# Patient Record
Sex: Male | Born: 1950 | Race: White | Hispanic: No | Marital: Married | Smoking: Former smoker
Health system: Southern US, Community
[De-identification: ages and names within clinical notes are randomized; demographics above are authoritative.]

## PROBLEM LIST (undated history)

## (undated) DIAGNOSIS — E785 Hyperlipidemia, unspecified: Secondary | ICD-10-CM

## (undated) DIAGNOSIS — I10 Essential (primary) hypertension: Secondary | ICD-10-CM

## (undated) DIAGNOSIS — M109 Gout, unspecified: Secondary | ICD-10-CM

## (undated) DIAGNOSIS — N281 Cyst of kidney, acquired: Secondary | ICD-10-CM

## (undated) DIAGNOSIS — I200 Unstable angina: Secondary | ICD-10-CM

## (undated) DIAGNOSIS — I2510 Atherosclerotic heart disease of native coronary artery without angina pectoris: Secondary | ICD-10-CM

## (undated) DIAGNOSIS — K635 Polyp of colon: Secondary | ICD-10-CM

## (undated) DIAGNOSIS — R7989 Other specified abnormal findings of blood chemistry: Secondary | ICD-10-CM

## (undated) DIAGNOSIS — R945 Abnormal results of liver function studies: Secondary | ICD-10-CM

## (undated) HISTORY — PX: CARDIAC CATHETERIZATION: SHX172

## (undated) HISTORY — PX: OTHER SURGICAL HISTORY: SHX169

## (undated) HISTORY — DX: Hyperlipidemia, unspecified: E78.5

## (undated) HISTORY — PX: COLONOSCOPY: SHX174

## (undated) HISTORY — DX: Abnormal results of liver function studies: R94.5

## (undated) HISTORY — DX: Polyp of colon: K63.5

## (undated) HISTORY — DX: Cyst of kidney, acquired: N28.1

## (undated) HISTORY — DX: Other specified abnormal findings of blood chemistry: R79.89

---

## 2004-01-04 DIAGNOSIS — Z860100 Personal history of colon polyps, unspecified: Secondary | ICD-10-CM | POA: Insufficient documentation

## 2007-12-27 ENCOUNTER — Ambulatory Visit: Payer: Self-pay | Admitting: Internal Medicine

## 2008-01-12 ENCOUNTER — Encounter: Payer: Self-pay | Admitting: Internal Medicine

## 2008-12-20 ENCOUNTER — Encounter (INDEPENDENT_AMBULATORY_CARE_PROVIDER_SITE_OTHER): Payer: Self-pay | Admitting: *Deleted

## 2009-10-18 ENCOUNTER — Telehealth: Payer: Self-pay | Admitting: Internal Medicine

## 2010-11-05 NOTE — Progress Notes (Signed)
Summary: Schedule Colonoscopy  Phone Note Outgoing Call Call back at Home Phone [phone]   Call placed by: Harlow Mares CMA Duncan Dull),  October 18, 2009 10:27 AM Call placed to: Patient Summary of Call: patient has a language barrier, but understands that I am calling about his colonoscopy and he is not interested. He states that it is to much water to drink and he is not interested he feels good. I told him he had polyps and I told him that Dr. Leone Payor would at least like him to come in to discuss the importance of the procedure but he is not interested at this time.  Initial call taken by: Harlow Mares CMA (AAMA),  October 18, 2009 10:29 AM

## 2011-02-18 NOTE — Assessment & Plan Note (Signed)
 HEALTHCARE                         GASTROENTEROLOGY OFFICE NOTE   NAME:George Burns, George Burns                 MRN:          [id number]  DATE:12/27/2007                            DOB:          Oct 25, 1950    CHIEF COMPLAINT:  Hemorrhoids.   This is a 60 year old [place] immigrant who has a diagnosis of  hemorrhoids from colonoscopy 5-6 years ago in [place].  He is now  having some itching and swelling and possibly some bright red blood on  the tissue paper.  He does have an interpreter but he speaks [place]  pretty well.  His bowel movements other than a rare hard stool are  formed and regular.  He has been using some Preparation-H  intermittently.   REVIEW OF SYSTEMS:  GI:  Otherwise unremarkable.  The remainder of the  review of systems is negative.   MEDICATIONS:  None, other than the Preparation-H.   ALLERGIES:  No known drug allergies.   PAST MEDICAL HISTORY:  1. He relates a possible history of hypertension.  2. He has shoulder pain and wonders if he has arthritis.   He denies any surgeries.   FAMILY HISTORY:  No colon cancer.  Our review is negative otherwise.   SOCIAL HISTORY:  He is married.  He is a Curator.  One son, two  daughters.  He has been in this country about 15 years and in [place]  about 2-3 years.  He drinks some alcohol.  No tobacco or drugs.   PHYSICAL EXAMINATION:  GENERAL:  Reveals a well developed, overweight to  obese, white man.  VITAL SIGNS:  Height 5 feet 5 inches, weight 180 pounds, blood pressure  140/82, pulse is 90 and regular.  EYES:  Anicteric.  ENT:  Normal mouth posterior pharynx.  NECK:  Supple.  No thyromegaly or mass.  CHEST:  Clear.  HEART:  S1 S2.  No murmurs, gallops, or rubs.  ABDOMEN:  Soft and nontender without organomegaly, mass.  RECTAL:  Reveals brown stool.  Hemoccult negative.  No mass.  There is  induration and obvious hemorrhoids right at the anal opening as well as  in the  canal.  There is no mass, no polypoid lesion.  Prostate is  normal.  SKIN:  He has multiple areas of sunburn from sun exposure over the  weekend.   ASSESSMENT:  1. Symptomatic internal hemorrhoids.  2. I should mention that the colonoscopy he had 5-6 years ago raises      the question of a polyp.  He is not sure.   PLAN:  1. Proctocream-HC 2.5% as needed to hemorrhoids.  2. We will request the records of his previous colonoscopy.  These      symptoms and signs evident today do not warrant a colonoscopy,      however, if he had an adenomatous colon polyp in the past it may be      appropriate.  3. He is advised to obtain a primary care physician.     Iva Boop, MD,FACG  Electronically Signed    CEG/MedQ  DD: 12/27/2007  DT: 12/27/2007  Job #: [id number]

## 2013-10-06 DIAGNOSIS — I2510 Atherosclerotic heart disease of native coronary artery without angina pectoris: Secondary | ICD-10-CM

## 2013-10-06 HISTORY — DX: Atherosclerotic heart disease of native coronary artery without angina pectoris: I25.10

## 2013-12-26 ENCOUNTER — Inpatient Hospital Stay (HOSPITAL_COMMUNITY)
Admission: EM | Admit: 2013-12-26 | Discharge: 2013-12-28 | DRG: 247 | Disposition: A | Discharge status: Home / Self Care | Payer: BC Managed Care – PPO | Attending: Internal Medicine | Admitting: Internal Medicine

## 2013-12-26 ENCOUNTER — Emergency Department (HOSPITAL_COMMUNITY): Payer: BC Managed Care – PPO

## 2013-12-26 ENCOUNTER — Encounter (HOSPITAL_COMMUNITY): Payer: Self-pay | Admitting: Emergency Medicine

## 2013-12-26 DIAGNOSIS — R61 Generalized hyperhidrosis: Secondary | ICD-10-CM | POA: Diagnosis present

## 2013-12-26 DIAGNOSIS — M109 Gout, unspecified: Secondary | ICD-10-CM | POA: Diagnosis present

## 2013-12-26 DIAGNOSIS — I200 Unstable angina: Secondary | ICD-10-CM | POA: Diagnosis present

## 2013-12-26 DIAGNOSIS — I208 Other forms of angina pectoris: Secondary | ICD-10-CM | POA: Diagnosis present

## 2013-12-26 DIAGNOSIS — I10 Essential (primary) hypertension: Secondary | ICD-10-CM | POA: Diagnosis present

## 2013-12-26 DIAGNOSIS — R0602 Shortness of breath: Secondary | ICD-10-CM | POA: Diagnosis present

## 2013-12-26 DIAGNOSIS — I2510 Atherosclerotic heart disease of native coronary artery without angina pectoris: Principal | ICD-10-CM | POA: Diagnosis present

## 2013-12-26 DIAGNOSIS — Z87891 Personal history of nicotine dependence: Secondary | ICD-10-CM

## 2013-12-26 DIAGNOSIS — R079 Chest pain, unspecified: Secondary | ICD-10-CM

## 2013-12-26 DIAGNOSIS — E785 Hyperlipidemia, unspecified: Secondary | ICD-10-CM | POA: Diagnosis present

## 2013-12-26 HISTORY — DX: Essential (primary) hypertension: I10

## 2013-12-26 HISTORY — DX: Unstable angina: I20.0

## 2013-12-26 HISTORY — DX: Gout, unspecified: M10.9

## 2013-12-26 LAB — CBC
HCT: 47.3 % (ref 39.0–52.0)
Hemoglobin: 16.5 g/dL (ref 13.0–17.0)
MCH: 31.7 pg (ref 26.0–34.0)
MCHC: 34.9 g/dL (ref 30.0–36.0)
MCV: 91 fL (ref 78.0–100.0)
PLATELETS: 193 10*3/uL (ref 150–400)
RBC: 5.2 MIL/uL (ref 4.22–5.81)
RDW: 12.8 % (ref 11.5–15.5)
WBC: 8.1 10*3/uL (ref 4.0–10.5)

## 2013-12-26 LAB — TROPONIN I
Troponin I: <0.3 ng/mL (ref <0.30)
Troponin I: <0.3 ng/mL (ref <0.30)

## 2013-12-26 LAB — BASIC METABOLIC PANEL
BUN: 14 mg/dL (ref 6–23)
CALCIUM: 10.1 mg/dL (ref 8.4–10.5)
CHLORIDE: 102 meq/L (ref 96–112)
CO2: 26 meq/L (ref 19–32)
Creatinine, Ser: 0.92 mg/dL (ref 0.50–1.35)
GFR calc Af Amer: >90 mL/min (ref >90)
GFR calc non Af Amer: 90 mL/min — ABNORMAL LOW (ref >90)
GLUCOSE: 118 mg/dL — AB (ref 70–99)
Potassium: 5 mEq/L (ref 3.7–5.3)
SODIUM: 143 meq/L (ref 137–147)

## 2013-12-26 LAB — I-STAT TROPONIN, ED: TROPONIN I, POC: 0.04 ng/mL (ref 0.00–0.08)

## 2013-12-26 MED ORDER — SODIUM CHLORIDE 0.9 % IJ SOLN: 3.0000 mL | Freq: Two times a day (BID) | INTRAMUSCULAR | Status: DC

## 2013-12-26 MED ORDER — SODIUM CHLORIDE 0.9 % IJ SOLN: 3.0000 mL | INTRAMUSCULAR | Status: DC | PRN

## 2013-12-26 MED ORDER — SODIUM CHLORIDE 0.9 % IJ SOLN
3.0000 mL | Freq: Two times a day (BID) | INTRAMUSCULAR | Status: DC
  Administered 2013-12-26: 3 mL via INTRAVENOUS

## 2013-12-26 MED ORDER — ATORVASTATIN CALCIUM 20 MG PO TABS
20.0000 mg | ORAL_TABLET | Freq: Every day | ORAL | Status: DC
  Administered 2013-12-26: 20 mg via ORAL
  Filled 2013-12-26 (×2): qty 1

## 2013-12-26 MED ORDER — HYDROCODONE-ACETAMINOPHEN 5-325 MG PO TABS: 1.0000 | ORAL_TABLET | ORAL | Status: DC | PRN

## 2013-12-26 MED ORDER — ADULT MULTIVITAMIN W/MINERALS CH
1.0000 | ORAL_TABLET | Freq: Every day | ORAL | Status: DC
  Administered 2013-12-27 – 2013-12-28 (×2): 1 via ORAL
  Filled 2013-12-26 (×2): qty 1

## 2013-12-26 MED ORDER — ACETAMINOPHEN 325 MG PO TABS: 650.0000 mg | ORAL_TABLET | Freq: Four times a day (QID) | ORAL | Status: DC | PRN

## 2013-12-26 MED ORDER — ASPIRIN EC 325 MG PO TBEC: 325.0000 mg | DELAYED_RELEASE_TABLET | Freq: Every day | ORAL | Status: DC

## 2013-12-26 MED ORDER — ONDANSETRON HCL 4 MG PO TABS: 4.0000 mg | ORAL_TABLET | Freq: Four times a day (QID) | ORAL | Status: DC | PRN

## 2013-12-26 MED ORDER — ASPIRIN 81 MG PO CHEW
324.0000 mg | CHEWABLE_TABLET | Freq: Once | ORAL | Status: AC
  Administered 2013-12-26: 324 mg via ORAL
  Filled 2013-12-26: qty 4

## 2013-12-26 MED ORDER — NITROGLYCERIN 0.4 MG SL SUBL: 0.4000 mg | SUBLINGUAL_TABLET | SUBLINGUAL | Status: DC | PRN

## 2013-12-26 MED ORDER — SODIUM CHLORIDE 0.9 % IV SOLN
1.0000 mL/kg/h | INTRAVENOUS | Status: DC
  Administered 2013-12-26 – 2013-12-27 (×2): 1 mL/kg/h via INTRAVENOUS

## 2013-12-26 MED ORDER — SODIUM CHLORIDE 0.9 % IV SOLN: 250.0000 mL | INTRAVENOUS | Status: DC | PRN

## 2013-12-26 MED ORDER — METOPROLOL TARTRATE 12.5 MG HALF TABLET
12.5000 mg | ORAL_TABLET | Freq: Two times a day (BID) | ORAL | Status: DC
Start: 2013-12-26 — End: 2013-12-28
  Administered 2013-12-26 – 2013-12-28 (×4): 12.5 mg via ORAL
  Filled 2013-12-26 (×6): qty 1

## 2013-12-26 MED ORDER — ASPIRIN 325 MG PO TABS: 325.0000 mg | ORAL_TABLET | ORAL | Status: DC

## 2013-12-26 MED ORDER — ENOXAPARIN SODIUM 40 MG/0.4ML ~~LOC~~ SOLN
40.0000 mg | SUBCUTANEOUS | Status: DC
  Administered 2013-12-26: 40 mg via SUBCUTANEOUS
  Filled 2013-12-26 (×3): qty 0.4

## 2013-12-26 MED ORDER — ASPIRIN 81 MG PO CHEW
81.0000 mg | CHEWABLE_TABLET | ORAL | Status: AC
  Administered 2013-12-27: 81 mg via ORAL
  Filled 2013-12-26: qty 1

## 2013-12-26 MED ORDER — ACETAMINOPHEN 650 MG RE SUPP: 650.0000 mg | Freq: Four times a day (QID) | RECTAL | Status: DC | PRN

## 2013-12-26 MED ORDER — ONDANSETRON HCL 4 MG/2ML IJ SOLN: 4.0000 mg | Freq: Four times a day (QID) | INTRAMUSCULAR | Status: DC | PRN

## 2013-12-26 NOTE — H&P (Signed)
[facility] History and Physical  JARELL MCEWEN WUJ:[id number] DOB: [birth year] DOA: 12/26/2013  Referring physician: Dr Lawrence Marseilles PCP: Pcp Not In System   Chief Complaint: Chest pain.   HPI: George Burns is a 63 y.o. male With PMH significant for Hypertension, his BP has been controlled off mediations, Hyperlipidemia, who presents complaining of chest pain that started day prior to admission. He was walking yesterday with his wife, every time he walk up the hill he develop chest pain, dyspnea, weakness, SOB. Chest pain resolved on rest. He had 3 episode yesterday. He saw his PCP and he was refer to see a cardiologist, but next appointment available was in 2 weeks. He was notice to have EKG changes compare to prior EKG.  He is chest pain free, first troponin negative, EKG with T wave inversion V 4 to V 6.     Review of Systems:  Negative except as per HPI.   Past Medical History  Diagnosis Date  . Hypertension   . Gout    History reviewed. No pertinent past surgical history. Social History:  reports that he has never smoked. He does not have any smokeless tobacco history on file. He reports that he drinks alcohol. He reports that he does not use illicit drugs. 28 year of smoking.   No Known Allergies  Family History; Mother: Heart diseases, HTN. Father died of lung cancer.   Prior to Admission medications   Medication Sig Start Date End Date Taking? Authorizing Provider  B Complex-C (B-COMPLEX WITH VITAMIN C) tablet Take 1 tablet by mouth daily.   Yes Historical Provider, MD  Flaxseed, Linseed, (FLAX SEED OIL PO) Take 1 tablet by mouth daily.   Yes Historical Provider, MD  Multiple Vitamins-Minerals (MULTIVITAMIN WITH MINERALS) tablet Take 1 tablet by mouth daily.   Yes Historical Provider, MD  VITAMIN E PO Take 1 tablet by mouth daily.   Yes Historical Provider, MD   Physical Exam: Filed Vitals:   12/26/13 1330  BP: 136/82  Pulse: 82  Temp:   Resp: 23      BP 136/82  Pulse 82  Temp(Src) 98.3 F (36.8 C) (Oral)  Resp 23  SpO2 96%  General:  Appears calm and comfortable Eyes: PERRL, normal lids, irises & conjunctiva ENT: grossly normal hearing, lips & tongue Neck: no LAD, masses or thyromegaly Cardiovascular: RRR, no m/r/g. No LE edema. Telemetry: SR, no arrhythmias  Respiratory: CTA bilaterally, no w/r/r. Normal respiratory effort. Abdomen: soft, ntnd Skin: no rash or induration seen on limited exam Musculoskeletal: grossly normal tone BUE/BLE Psychiatric: grossly normal mood and affect, speech fluent and appropriate Neurologic: grossly non-focal.          Labs on Admission:  Basic Metabolic Panel:  Recent Labs Lab 12/26/13 1145  NA 143  K 5.0  CL 102  CO2 26  GLUCOSE 118*  BUN 14  CREATININE 0.92  CALCIUM 10.1   Liver Function Tests: No results found for this basename: AST, ALT, ALKPHOS, BILITOT, PROT, ALBUMIN,  in the last 168 hours No results found for this basename: LIPASE, AMYLASE,  in the last 168 hours No results found for this basename: AMMONIA,  in the last 168 hours CBC:  Recent Labs Lab 12/26/13 1145  WBC 8.1  HGB 16.5  HCT 47.3  MCV 91.0  PLT 193   Cardiac Enzymes: No results found for this basename: CKTOTAL, CKMB, CKMBINDEX, TROPONINI,  in the last 168 hours  BNP (last 3 results) No results found for  this basename: PROBNP,  in the last 8760 hours CBG: No results found for this basename: GLUCAP,  in the last 168 hours  Radiological Exams on Admission: Dg Chest 2 View  12/26/2013   CLINICAL DATA:  Chest pain  EXAM: CHEST  2 VIEW  COMPARISON:  None.  FINDINGS: The heart size and mediastinal contours are within normal limits. Both lungs are clear. The visualized skeletal structures are unremarkable.  IMPRESSION: No active cardiopulmonary disease.   Electronically Signed   By: Britta MccreedySusan  Turner M.D.   On: 12/26/2013 13:05    EKG: Independently reviewed.   Assessment/Plan Principal Problem:    Chest pain Active Problems:   Hyperlipidemia   History of smoking   Hypertension  1-Chest pain; with typical symptoms. EKG with T wave inversion V 4 to V 6. First troponin negative. Chest X Ray negative. Continue to cycle cardiac enzymes. EKG in Am. Cardiology consulted. Nitroglycerin PRN, Aspirin.   2-Hypertension; well controlled. Not on medications.  3-Hyperlipidemia; check Lipid panel.   Code Status: Presume Full Code.  Family Communication: Care discussed with wife.  Disposition Plan: Admit under observation less than 2 nights.   Time spent: 75 minutes.   Hartley Barefootegalado, Aneyah Lortz A [facility] Pager [phone]

## 2013-12-26 NOTE — ED Notes (Signed)
Pt reports left sided chest pain radiating to left arm while walking in the park yesterday. Pt was diaphoretic after walk, pt not feeling well. Pt began to feel better after resting yesterday afternoon. This AM saw his PMD and was sent over for further eval. Pt resp, even unlabored, skin warm and dry.

## 2013-12-26 NOTE — ED Provider Notes (Signed)
CSN: [id number]     Arrival date & time 12/26/13  1123 History   First MD Initiated Contact with Patient 12/26/13 1145     Chief Complaint  Patient presents with  . Chest Pain     (Consider location/radiation/quality/duration/timing/severity/associated sxs/prior Treatment) HPI 63 year old male presents from his primary care physician's office with new EKG changes. Patient's primary care physician is Dr. Lerry Liner. The patient had 2 episodes of chest pressure, shortness of breath, radiation of his pain, and diaphoresis while walking yesterday. These occurred on his typical walk in the park. Patient to stop multiple times was able to walk home. The patient's has felt a symptomatically plan. EKG was done while he is pain-free. He has no history of hyperlipidemia or smoking. He used to hypertension but has been taken off his medicines and exercise. The PCP made an appointment with a cardiologist but he cannot be seen for another 2 weeks.  Past Medical History  Diagnosis Date  . Hypertension   . Gout    No past surgical history on file. No family history on file. History  Substance Use Topics  . Smoking status: Never Smoker   . Smokeless tobacco: Not on file  . Alcohol Use: Yes     Comment: occasional    Review of Systems  Constitutional: Positive for diaphoresis.  Respiratory: Positive for shortness of breath. Negative for cough.   Cardiovascular: Positive for chest pain.  Gastrointestinal: Negative for nausea, vomiting and abdominal pain.  Neurological: Negative for weakness.  All other systems reviewed and are negative.      Allergies  Review of patient's allergies indicates no known allergies.  Home Medications  No current outpatient prescriptions on file. BP 143/94  Temp(Src) 97.8 F (36.6 C) (Oral)  Resp 18  SpO2 100% Physical Exam  Nursing note and vitals reviewed. Constitutional: He is oriented to person, place, and time. He appears well-developed and  well-nourished.  HENT:  Head: Normocephalic and atraumatic.  Right Ear: External ear normal.  Left Ear: External ear normal.  Nose: Nose normal.  Eyes: Right eye exhibits no discharge. Left eye exhibits no discharge.  Neck: Neck supple.  Cardiovascular: Normal rate, regular rhythm, normal heart sounds and intact distal pulses.   Pulmonary/Chest: Effort normal and breath sounds normal.  Abdominal: Soft. He exhibits no distension. There is no tenderness.  Musculoskeletal: He exhibits no edema.  Neurological: He is alert and oriented to person, place, and time.  Skin: Skin is warm and dry.    ED Course  Procedures (including critical care time) Labs Review Labs Reviewed  BASIC METABOLIC PANEL - Abnormal; Notable for the following:    Glucose, Bld 118 (*)    GFR calc non Af Amer 90 (*)    All other components within normal limits  CBC  I-STAT TROPOININ, ED   Imaging Review Dg Chest 2 View  12/26/2013   CLINICAL DATA:  Chest pain  EXAM: CHEST  2 VIEW  COMPARISON:  None.  FINDINGS: The heart size and mediastinal contours are within normal limits. Both lungs are clear. The visualized skeletal structures are unremarkable.  IMPRESSION: No active cardiopulmonary disease.   Electronically Signed   By: Britta Mccreedy M.D.   On: 12/26/2013 13:05     EKG Interpretation   Date/Time:  Monday December 26 2013 13:09:52 EDT Ventricular Rate:  87 PR Interval:  206 QRS Duration: 122 QT Interval:  389 QTC Calculation: 468 R Axis:   -105 Text Interpretation:  Sinus rhythm Borderline  prolonged PR interval Right  bundle branch block Inferior infarct, age indeterminate Lateral leads are  also involved T wave changes are new compared to outside EKG Confirmed by  Dejanee Thibeaux  MD, Denni France [phone]([id number]) on 12/26/2013 1:20:11 PM      MDM   Final diagnoses:  Chest pain  Hyperlipidemia  Intermediate coronary syndrome    Patient asymptomatic at this time, but has concerning story for angina from yesterday.  With his new T wave changes in infero-lateral leads, will need expedited workup. As he is currently asymptomatic and has normal troponin, I will admit to hospitalist and consult cardiology.    Audree CamelScott T Cherree Conerly, MD 12/26/13 [phone]

## 2013-12-26 NOTE — Consult Note (Signed)
Reason for Consult:  Chest Pain Referring Physician: Regalado Cardiologist: New  George Burns is an 63 y.o. male.  HPI:   The patient is a 63 yo male from [place] who works in the [employer], with a history of HTN and gout.  He usually walks about three miles on the weekend with his wife and is job is very physical.  This weekend, while walking, he notice every time he walked fast or up hill he developed CP with radiation to the left shoulder, diaphoresis, and SOB.  He was very tired and could not keep up with his wife when he usually does not have a problem.  The patient currently denies nausea, vomiting, fever, orthopnea, dizziness, PND, cough, congestion, abdominal pain, hematochezia, melena, lower extremity edema, claudication.  He went to his PCP who recommended he go to the hospital.     Past Medical History  Diagnosis Date  . Hypertension   . Gout     History reviewed. No pertinent past surgical history.  Family History  Problem Relation Age of Onset  . Heart attack Mother   . Cancer Father     Social History:  reports that he has never smoked. He does not have any smokeless tobacco history on file. He reports that he drinks alcohol. He reports that he does not use illicit drugs.  Allergies: No Known Allergies  Medications:  Scheduled Meds: . [START ON 12/27/2013] aspirin EC  325 mg Oral Daily  . enoxaparin (LOVENOX) injection  40 mg Subcutaneous Q24H  . [START ON 12/27/2013] multivitamin with minerals  1 tablet Oral Daily  . sodium chloride  3 mL Intravenous Q12H  . sodium chloride  3 mL Intravenous Q12H   Continuous Infusions:  PRN Meds:.sodium chloride, acetaminophen, acetaminophen, HYDROcodone-acetaminophen, nitroGLYCERIN, ondansetron (ZOFRAN) IV, ondansetron, sodium chloride   Results for orders placed during the hospital encounter of 12/26/13 (from the past 48 hour(s))  CBC     Status: None   Collection Time    12/26/13 11:45 AM      Result  Value Ref Range   WBC 8.1  4.0 - 10.5 K/uL   RBC 5.20  4.22 - 5.81 MIL/uL   Hemoglobin 16.5  13.0 - 17.0 g/dL   HCT 47.3  39.0 - 52.0 %   MCV 91.0  78.0 - 100.0 fL   MCH 31.7  26.0 - 34.0 pg   MCHC 34.9  30.0 - 36.0 g/dL   RDW 12.8  11.5 - 15.5 %   Platelets 193  150 - 400 K/uL  BASIC METABOLIC PANEL     Status: Abnormal   Collection Time    12/26/13 11:45 AM      Result Value Ref Range   Sodium 143  137 - 147 mEq/L   Potassium 5.0  3.7 - 5.3 mEq/L   Chloride 102  96 - 112 mEq/L   CO2 26  19 - 32 mEq/L   Glucose, Bld 118 (*) 70 - 99 mg/dL   BUN 14  6 - 23 mg/dL   Creatinine, Ser 0.92  0.50 - 1.35 mg/dL   Calcium 10.1  8.4 - 10.5 mg/dL   GFR calc non Af Amer 90 (*) >90 mL/min   GFR calc Af Amer >90  >90 mL/min   Comment: (NOTE)     The eGFR has been calculated using the CKD EPI equation.     This calculation has not been validated in all clinical situations.  eGFR's persistently <90 mL/min signify possible Chronic Kidney     Disease.  George Burns, ED     Status: None   Collection Time    12/26/13 12:02 PM      Result Value Ref Range   Troponin i, poc 0.04  0.00 - 0.08 ng/mL   Comment 3            Comment: Due to the release kinetics of cTnI,     a negative result within the first hours     of the onset of symptoms does not rule out     myocardial infarction with certainty.     If myocardial infarction is still suspected,     repeat the test at appropriate intervals.    Dg Chest 2 View  12/26/2013   CLINICAL DATA:  Chest pain  EXAM: CHEST  2 VIEW  COMPARISON:  None.  FINDINGS: The heart size and mediastinal contours are within normal limits. Both lungs are clear. The visualized skeletal structures are unremarkable.  IMPRESSION: No active cardiopulmonary disease.   Electronically Signed   By: George Burns M.D.   On: 12/26/2013 13:05    Review of Systems  Constitutional: Positive for diaphoresis. Negative for fever and chills.  HENT: Negative for congestion and  sore throat.   Respiratory: Positive for shortness of breath.   Cardiovascular: Positive for chest pain. Negative for palpitations, orthopnea, leg swelling and PND.  Gastrointestinal: Negative for nausea and vomiting.  Genitourinary: Negative for hematuria.  Musculoskeletal: Negative for myalgias.  Neurological: Negative for dizziness.  All other systems reviewed and are negative.   Blood pressure 127/85, pulse 75, temperature 98.4 F (36.9 C), temperature source Oral, resp. rate 20, height 5' 6"  (1.676 m), weight 176 lb (79.833 kg), SpO2 96.00%. Physical Exam  Nursing note and vitals reviewed. Constitutional: He is oriented to person, place, and time. He appears well-developed and well-nourished. No distress.  HENT:  Head: Normocephalic and atraumatic.  Eyes: EOM are normal. Pupils are equal, round, and reactive to light. No scleral icterus.  Neck: Normal range of motion. Neck supple. No JVD present.  Cardiovascular: Normal rate, regular rhythm, S1 normal and S2 normal.   No murmur heard. Pulses:      Radial pulses are 2+ on the right side, and 2+ on the left side.       Dorsalis pedis pulses are 2+ on the right side, and 2+ on the left side.  No Carotid Bruits   Respiratory: Effort normal and breath sounds normal. He has no wheezes. He has no rales.  GI: Soft. Bowel sounds are normal. He exhibits no distension. There is no tenderness.  Musculoskeletal: He exhibits no edema.  Lymphadenopathy:    He has no cervical adenopathy.  Neurological: He is alert and oriented to person, place, and time. He exhibits normal muscle tone.  Skin: Skin is warm and dry.  Psychiatric: He has a normal mood and affect.    Assessment/Plan: Principal Problem:   Stable angina Active Problems:   Hyperlipidemia   History of smoking   Hypertension  Plan:  Currently pain free.  New anterolateral TWI on EKG.  Left heart cath tomorrow.  Add lopressor 12.5 mg BID.   AM lipids.  Statin.  Bp well  controlled.    George Burns, George Burns 12/26/2013, 4:13 PM     I have examined the patient and reviewed assessment and plan and discussed with patient.  Agree with above as stated.  New onset angina.  ECG changes.  Patient is agreeable to cath. NPO past modnight.  Continue aspirin, statin and beta blocker. Nitrates as needed for chest pain.  George Duling S.

## 2013-12-27 ENCOUNTER — Encounter (HOSPITAL_COMMUNITY)
Admission: EM | Disposition: A | Discharge status: Home / Self Care | Payer: BC Managed Care – PPO | Source: Home / Self Care | Attending: Internal Medicine

## 2013-12-27 DIAGNOSIS — I2510 Atherosclerotic heart disease of native coronary artery without angina pectoris: Principal | ICD-10-CM

## 2013-12-27 DIAGNOSIS — Z87891 Personal history of nicotine dependence: Secondary | ICD-10-CM

## 2013-12-27 DIAGNOSIS — I200 Unstable angina: Secondary | ICD-10-CM | POA: Diagnosis present

## 2013-12-27 DIAGNOSIS — I209 Angina pectoris, unspecified: Secondary | ICD-10-CM

## 2013-12-27 HISTORY — PX: LEFT HEART CATHETERIZATION WITH CORONARY ANGIOGRAM: SHX5451

## 2013-12-27 LAB — CBC
HCT: 44 % (ref 39.0–52.0)
Hemoglobin: 14.8 g/dL (ref 13.0–17.0)
MCH: 31 pg (ref 26.0–34.0)
MCHC: 33.6 g/dL (ref 30.0–36.0)
MCV: 92.2 fL (ref 78.0–100.0)
Platelets: 173 10*3/uL (ref 150–400)
RBC: 4.77 MIL/uL (ref 4.22–5.81)
RDW: 13 % (ref 11.5–15.5)
WBC: 6.3 10*3/uL (ref 4.0–10.5)

## 2013-12-27 LAB — BASIC METABOLIC PANEL
BUN: 17 mg/dL (ref 6–23)
BUN: 12 mg/dL (ref 6–23)
CHLORIDE: 107 meq/L (ref 96–112)
CO2: 26 meq/L (ref 19–32)
CO2: 22 mEq/L (ref 19–32)
CREATININE: 1.07 mg/dL (ref 0.50–1.35)
Calcium: 9.1 mg/dL (ref 8.4–10.5)
Calcium: 8.8 mg/dL (ref 8.4–10.5)
Chloride: 105 mEq/L (ref 96–112)
Creatinine, Ser: 0.97 mg/dL (ref 0.50–1.35)
GFR calc Af Amer: 85 mL/min — ABNORMAL LOW (ref >90)
GFR calc non Af Amer: 74 mL/min — ABNORMAL LOW (ref >90)
GFR, EST NON AFRICAN AMERICAN: 88 mL/min — AB (ref >90)
Glucose, Bld: 107 mg/dL — ABNORMAL HIGH (ref 70–99)
Glucose, Bld: 137 mg/dL — ABNORMAL HIGH (ref 70–99)
POTASSIUM: 3.6 meq/L — AB (ref 3.7–5.3)
Potassium: 5.5 mEq/L — ABNORMAL HIGH (ref 3.7–5.3)
SODIUM: 141 meq/L (ref 137–147)
Sodium: 144 mEq/L (ref 137–147)

## 2013-12-27 LAB — TROPONIN I: Troponin I: <0.3 ng/mL (ref <0.30)

## 2013-12-27 LAB — LIPID PANEL
CHOL/HDL RATIO: 4.4 ratio
Cholesterol: 192 mg/dL (ref 0–200)
HDL: 44 mg/dL (ref >39)
LDL Cholesterol: 123 mg/dL — ABNORMAL HIGH (ref 0–99)
Triglycerides: 125 mg/dL (ref <150)
VLDL: 25 mg/dL (ref 0–40)

## 2013-12-27 LAB — PROTIME-INR
INR: 0.98 (ref 0.00–1.49)
PROTHROMBIN TIME: 12.8 s (ref 11.6–15.2)

## 2013-12-27 LAB — POCT ACTIVATED CLOTTING TIME: Activated Clotting Time: 360 seconds

## 2013-12-27 SURGERY — LEFT HEART CATHETERIZATION WITH CORONARY ANGIOGRAM
Anesthesia: LOCAL

## 2013-12-27 MED ORDER — HEPARIN SODIUM (PORCINE) 1000 UNIT/ML IJ SOLN
INTRAMUSCULAR | Status: AC
  Filled 2013-12-27: qty 1

## 2013-12-27 MED ORDER — SODIUM CHLORIDE 0.9 % IV SOLN
INTRAVENOUS | Status: DC
  Administered 2013-12-27: 19:00:00 100 mL/h via INTRAVENOUS

## 2013-12-27 MED ORDER — ATORVASTATIN CALCIUM 80 MG PO TABS
80.0000 mg | ORAL_TABLET | Freq: Every day | ORAL | Status: DC
  Administered 2013-12-27: 80 mg via ORAL
  Filled 2013-12-27 (×2): qty 1

## 2013-12-27 MED ORDER — MIDAZOLAM HCL 2 MG/2ML IJ SOLN
INTRAMUSCULAR | Status: AC
  Filled 2013-12-27: qty 2

## 2013-12-27 MED ORDER — TICAGRELOR 90 MG PO TABS
90.0000 mg | ORAL_TABLET | Freq: Two times a day (BID) | ORAL | Status: DC
  Administered 2013-12-27 – 2013-12-28 (×2): 90 mg via ORAL
  Filled 2013-12-27 (×3): qty 1

## 2013-12-27 MED ORDER — ASPIRIN EC 81 MG PO TBEC
81.0000 mg | DELAYED_RELEASE_TABLET | Freq: Every day | ORAL | Status: DC
  Administered 2013-12-28: 10:00:00 81 mg via ORAL
  Filled 2013-12-27 (×2): qty 1

## 2013-12-27 MED ORDER — NITROGLYCERIN 0.2 MG/ML ON CALL CATH LAB
INTRAVENOUS | Status: AC
  Filled 2013-12-27: qty 1

## 2013-12-27 MED ORDER — ATORVASTATIN CALCIUM 80 MG PO TABS: 80.0000 mg | ORAL_TABLET | Freq: Every day | ORAL | Status: DC

## 2013-12-27 MED ORDER — TICAGRELOR 90 MG PO TABS
ORAL_TABLET | ORAL | Status: AC
  Administered 2013-12-27: 22:00:00 90 mg via ORAL
  Filled 2013-12-27: qty 1

## 2013-12-27 MED ORDER — HEPARIN (PORCINE) IN NACL 2-0.9 UNIT/ML-% IJ SOLN
INTRAMUSCULAR | Status: AC
  Filled 2013-12-27: qty 1000

## 2013-12-27 MED ORDER — LIDOCAINE HCL (PF) 1 % IJ SOLN
INTRAMUSCULAR | Status: AC
  Filled 2013-12-27: qty 30

## 2013-12-27 MED ORDER — FENTANYL CITRATE 0.05 MG/ML IJ SOLN
INTRAMUSCULAR | Status: AC
  Filled 2013-12-27: qty 2

## 2013-12-27 MED ORDER — BIVALIRUDIN 250 MG IV SOLR
INTRAVENOUS | Status: AC
  Filled 2013-12-27: qty 250

## 2013-12-27 MED ORDER — TICAGRELOR 90 MG PO TABS
ORAL_TABLET | ORAL | Status: AC
  Filled 2013-12-27: qty 1

## 2013-12-27 MED ORDER — ASPIRIN EC 81 MG PO TBEC: 81.0000 mg | DELAYED_RELEASE_TABLET | Freq: Every day | ORAL | Status: DC

## 2013-12-27 MED ORDER — VERAPAMIL HCL 2.5 MG/ML IV SOLN
INTRAVENOUS | Status: AC
  Filled 2013-12-27: qty 2

## 2013-12-27 NOTE — Progress Notes (Signed)
[facility] PROGRESS NOTE  George Burns [facility] KGY:[id number] DOB: 04/05/1951 DOA: 12/26/2013 PCP: Pcp Not In System  Assessment/Plan: 1-Stable Angina; EKG with T wave inversion V 4 to V 6. troponin times 3 negative. LDL at 123. Started on lipitor, metoprolol. For Cath today.   2-increase potassium; repeat b-met this afternoon. If still elevated will give kayexalate.   Code Status: full code.  Family Communication: Discussed with wife.  Disposition Plan: to be detremine   Consultants:  Cardiology  Procedures:  CAth pending.   Antibiotics:  none  HPI/Subjective: No chest pain, feeling well.   Objective: Filed Vitals:   12/27/13 1107  BP: 118/72  Pulse:   Temp:   Resp:     Intake/Output Summary (Last 24 hours) at 12/27/13 1413 Last data filed at 12/26/13 1700  Gross per 24 hour  Intake    240 ml  Output      0 ml  Net    240 ml   Filed Weights   12/26/13 1500  Weight: 79.833 kg (176 lb)    Exam:   General:  No distress.   Cardiovascular: S 1, S 2 RRR  Respiratory: CTA  Abdomen: BS present, soft, NT  Musculoskeletal: no edema.   Data Reviewed: Basic Metabolic Panel:  Recent Labs Lab 12/26/13 1145 12/27/13 0237  NA 143 144  K 5.0 5.5*  CL 102 107  CO2 26 26  GLUCOSE 118* 107*  BUN 14 17  CREATININE 0.92 1.07  CALCIUM 10.1 9.1   Liver Function Tests: No results found for this basename: AST, ALT, ALKPHOS, BILITOT, PROT, ALBUMIN,  in the last 168 hours No results found for this basename: LIPASE, AMYLASE,  in the last 168 hours No results found for this basename: AMMONIA,  in the last 168 hours CBC:  Recent Labs Lab 12/26/13 1145 12/27/13 0237  WBC 8.1 6.3  HGB 16.5 14.8  HCT 47.3 44.0  MCV 91.0 92.2  PLT 193 173   Cardiac Enzymes:  Recent Labs Lab 12/26/13 1537 12/26/13 2008 12/27/13 0237  TROPONINI <0.30 <0.30 <0.30   BNP (last 3 results) No results found for this basename: PROBNP,  in the last 8760  hours CBG: No results found for this basename: GLUCAP,  in the last 168 hours  No results found for this or any previous visit (from the past 240 hour(s)).   Studies: Dg Chest 2 View  12/26/2013   CLINICAL DATA:  Chest pain  EXAM: CHEST  2 VIEW  COMPARISON:  None.  FINDINGS: The heart size and mediastinal contours are within normal limits. Both lungs are clear. The visualized skeletal structures are unremarkable.  IMPRESSION: No active cardiopulmonary disease.   Electronically Signed   By: Curlene Dolphin M.D.   On: 12/26/2013 13:05    Scheduled Meds: . [facility] HOLD] aspirin EC  325 mg Oral Daily  . [facility] HOLD] atorvastatin  20 mg Oral q1800  . [MAR HOLD] enoxaparin (LOVENOX) injection  40 mg Subcutaneous Q24H  . [facility] HOLD] metoprolol tartrate  12.5 mg Oral BID  . [MAR HOLD] multivitamin with minerals  1 tablet Oral Daily  . [MAR HOLD] sodium chloride  3 mL Intravenous Q12H  . [facility] HOLD] sodium chloride  3 mL Intravenous Q12H  . sodium chloride  3 mL Intravenous Q12H   Continuous Infusions: . sodium chloride 1 mL/kg/hr (12/27/13 1133)    Principal Problem:   Stable angina Active Problems:   Hyperlipidemia   History of smoking   Hypertension  Intermediate coronary syndrome    Time spent: 25 minutes.     Niel Hummer A  [facility] Pager [phone]. If 7PM-7AM, please contact night-coverage at [email or web address], password [facility] 12/27/2013, 2:13 PM  LOS: 1 day

## 2013-12-27 NOTE — Progress Notes (Signed)
TR BAND REMOVAL  LOCATION:    right radial  DEFLATED PER PROTOCOL:    yes  TIME BAND OFF / DRESSING APPLIED:    1815   SITE UPON ARRIVAL:    Level 0  SITE AFTER BAND REMOVAL:    Level 0  REVERSE ALLEN'S TEST:     positive  CIRCULATION SENSATION AND MOVEMENT:    Within Normal Limits   yes  COMMENTS:   Tolerated procedure well 

## 2013-12-27 NOTE — CV Procedure (Signed)
George JunkerStanislaw J Burns is a 63 y.o. male    [id number]  [id number] LOCATION:  FACILITY: [facility]  PHYSICIAN: Lennette Biharihomas A. Soundra Lampley, MD, [facility] 15-Aug-1951   DATE OF PROCEDURE:  12/27/2013    CARDIAC CATHETERIZATION/PERCUTANEOUS CORONARY INTERVENTION     HISTORY:  Mr. Dorian PodStanislaw Mumme is a 63 year old gentleman who is originally from [place]. He has a history of hypertension and gout. He recently developed new onset chest pain with an accelerated pattern. He was admitted to the hospital yesterday. ECG has shown evolving inferolateral T-wave inversion. He presents now for definitive cardiac catheterization and possible percutaneous coronary intervention.   PROCEDURE:  The patient was brought to the second floor [place] [facility] cath lab in the postabsorptive state. He was premedicated with Versed 2 mg and fentanyl 50 mcg. Right radial access was utilized after a Allen's test was positive. The right radial artery was punctured by the Seldinger technique and a 6 [place]French glidesheath slender was inserted without difficulty. A radial cocktail consisting of lidocaine, IV nitroglycerin and verapamil was administered. Heparin 4000 units was administered after the safety J-wire was advanced to the ascending aorta. Initially, a 5 [place] FR4 catheter was inserted but this was unsuccessful at cannulating the RCA. A 5 French FL3.5 catheter was used for diagnostic angiography and the left coronary system. A 5 [place] EZRad  Catheter was used to selectively cannulate the RCA. A 5 French pigtail catheter was used for RAO ventriculography. With the demonstration of a subtotal LAD stenosis the findings were discussed with the patient and PCI was performed.  Angiomax bolus plus infusion was administered and the patient received 180 mg of brilinta. A 6 French XB 3.5 LAD was inserted and after documentation of therapeutic anticoagulation a pro-water wire was advanced down the LAD system. Predilatation was done with a 2.5x12 mm  sprinter lesion balloon at 6 and 10 atmospheres. A 3.5x16 mm Promus Premier  DES stent was then inserted in deployed at 12 and 13 atmospheres. Post stent dilatation was done with a 3.75x12 mm [place] Trek. Scout angiography confirmed an excellent angiographic result with a 99% stenosis being reduced to 0%. A TR band was applied at 15:00 and with 12 cc of air. He left the catheterization laboratory chest pain-free with stable hemodynamics.  HEMODYNAMICS:   Central Aorta: 114/66   Left Ventricle: 114/14  ANGIOGRAPHY:  Left Main is radiographically normal and bifurcated into a large LAD and left circumflex coronary artery.  The left anterior descending artery gave rise to prominent proximal diagonal vessel and a prominent proximal septal perforating artery. Immediately beyond the septal perforator artery the LAD was 99% stenosed. There was TIMI-3 flow. The LAD extended to and wrapped around the apex.  Left circumflex vessel is angiographically normal as do 2 proximal marginal vessels and a more distal marginal branch.  The RCA was a very large caliber dominant vessel which gave rise to a very large PDA and posterolateral system.  RAO left ventriculography revealed an ejection fraction of 50%. There was mild mid distal anterolateral hypocontractility.  Following successful percutaneous coronary intervention to the LAD with PTCA and ultimate stenting with a 3.5x16 mm Promus Premier DES stent, postdilated to 3.71 mm, the 99% stenosis was reduced to 0%.  IMPRESSION:  Low normal to mild LV dysfunction with mild anterolateral hypocontractility and ejection fraction of 50%   99% stenosis in the proximal to mid LAD just beyond a moderate size septal perforating artery  Normal left circumflex and normal large dominant right coronary artery  Successful percutaneous coronary intervention to the LAD with the 99% stenosis being reduced to 0% with ultimate insertion of a 3.5x16 mm Promus Premier DES stent  postdilated 3.71 mm  Angiomax/180 mg oral brilinta/IC nitroglycerin  Lennette Bihari, MD, [facility] 12/27/2013 3:49 PM

## 2013-12-27 NOTE — Interval H&P Note (Signed)
History and Physical Interval Note:  12/27/2013 1:34 PM  George Burns  has presented today for surgery, with the diagnosis of CP  The various methods of treatment have been discussed with the patient and family. After consideration of risks, benefits and other options for treatment, the patient has consented to  Procedure(s): LEFT HEART CATHETERIZATION WITH CORONARY ANGIOGRAM (N/A) Cath Lab Visit (complete for each Cath Lab visit)  Clinical Evaluation Leading to the Procedure:   ACS: no  Non-ACS:    Anginal Classification: CCS III  Anti-ischemic medical therapy: Maximal Therapy (2 or more classes of medications)  Non-Invasive Test Results: No non-invasive testing performed  Prior CABG: No previous CABG      and possible PCI as a surgical intervention .  The patient's history has been reviewed, patient examined, no change in status, stable for surgery.  I have reviewed the patient's chart and labs.  Questions were answered to the patient's satisfaction.     KELLY,THOMAS A

## 2013-12-27 NOTE — Progress Notes (Signed)
     SUBJECTIVE: No chest pain this am.   BP 118/80  Pulse 73  Temp(Src) 98.1 F (36.7 C) (Oral)  Resp 18  Ht 5\' 6"  (1.676 m)  Wt 176 lb (79.833 kg)  BMI 28.42 kg/m2  SpO2 95%  Intake/Output Summary (Last 24 hours) at 12/27/13 1046 Last data filed at 12/26/13 1700  Gross per 24 hour  Intake    240 ml  Output      0 ml  Net    240 ml    PHYSICAL EXAM General: Well developed, well nourished, in no acute distress. Alert and oriented x 3.  Psych:  Good affect, responds appropriately Neck: No JVD. No masses noted.  Lungs: Clear bilaterally with no wheezes or rhonci noted.  Heart: RRR with no murmurs noted. Abdomen: Bowel sounds are present. Soft, non-tender.  Extremities: No lower extremity edema.   LABS: Basic Metabolic Panel:  Recent Labs  16/07/9602/23/15 1145 12/27/13 0237  NA 143 144  K 5.0 5.5*  CL 102 107  CO2 26 26  GLUCOSE 118* 107*  BUN 14 17  CREATININE 0.92 1.07  CALCIUM 10.1 9.1   CBC:  Recent Labs  12/26/13 1145 12/27/13 0237  WBC 8.1 6.3  HGB 16.5 14.8  HCT 47.3 44.0  MCV 91.0 92.2  PLT 193 173   Cardiac Enzymes:  Recent Labs  12/26/13 1537 12/26/13 2008 12/27/13 0237  TROPONINI <0.30 <0.30 <0.30   Fasting Lipid Panel:  Recent Labs  12/27/13 0237  CHOL 192  HDL 44  LDLCALC 123*  TRIG 125  CHOLHDL 4.4    Current Meds: . aspirin EC  325 mg Oral Daily  . atorvastatin  20 mg Oral q1800  . enoxaparin (LOVENOX) injection  40 mg Subcutaneous Q24H  . metoprolol tartrate  12.5 mg Oral BID  . multivitamin with minerals  1 tablet Oral Daily  . sodium chloride  3 mL Intravenous Q12H  . sodium chloride  3 mL Intravenous Q12H  . sodium chloride  3 mL Intravenous Q12H   ASSESSMENT AND PLAN:  1. Unstable angina:  Pt admitted with chest pain concerning for angina. Given symptoms of chest pain with minimal exertion, this is c/w class III angina. Plan cardiac cath later today to exclude CAD. If PCI is indicated, this would appropriate given  his symptoms. Continue ASA, statin, beta blocker. EKG with inferior and anterolateral T wave inversions. Sinus.   2. HTN: BP controlled.   3. HLD: He is on a statin.   MCALHANY,CHRISTOPHER  3/24/201510:46 AM

## 2013-12-27 NOTE — H&P (View-Only) (Signed)
     SUBJECTIVE: No chest pain this am.   BP 118/80  Pulse 73  Temp(Src) 98.1 F (36.7 C) (Oral)  Resp 18  Ht 5' 6" (1.676 m)  Wt 176 lb (79.833 kg)  BMI 28.42 kg/m2  SpO2 95%  Intake/Output Summary (Last 24 hours) at 12/27/13 1046 Last data filed at 12/26/13 1700  Gross per 24 hour  Intake    240 ml  Output      0 ml  Net    240 ml    PHYSICAL EXAM General: Well developed, well nourished, in no acute distress. Alert and oriented x 3.  Psych:  Good affect, responds appropriately Neck: No JVD. No masses noted.  Lungs: Clear bilaterally with no wheezes or rhonci noted.  Heart: RRR with no murmurs noted. Abdomen: Bowel sounds are present. Soft, non-tender.  Extremities: No lower extremity edema.   LABS: Basic Metabolic Panel:  Recent Labs  12/26/13 1145 12/27/13 0237  NA 143 144  K 5.0 5.5*  CL 102 107  CO2 26 26  GLUCOSE 118* 107*  BUN 14 17  CREATININE 0.92 1.07  CALCIUM 10.1 9.1   CBC:  Recent Labs  12/26/13 1145 12/27/13 0237  WBC 8.1 6.3  HGB 16.5 14.8  HCT 47.3 44.0  MCV 91.0 92.2  PLT 193 173   Cardiac Enzymes:  Recent Labs  12/26/13 1537 12/26/13 2008 12/27/13 0237  TROPONINI <0.30 <0.30 <0.30   Fasting Lipid Panel:  Recent Labs  12/27/13 0237  CHOL 192  HDL 44  LDLCALC 123*  TRIG 125  CHOLHDL 4.4    Current Meds: . aspirin EC  325 mg Oral Daily  . atorvastatin  20 mg Oral q1800  . enoxaparin (LOVENOX) injection  40 mg Subcutaneous Q24H  . metoprolol tartrate  12.5 mg Oral BID  . multivitamin with minerals  1 tablet Oral Daily  . sodium chloride  3 mL Intravenous Q12H  . sodium chloride  3 mL Intravenous Q12H  . sodium chloride  3 mL Intravenous Q12H   ASSESSMENT AND PLAN:  1. Unstable angina:  Pt admitted with chest pain concerning for angina. Given symptoms of chest pain with minimal exertion, this is c/w class III angina. Plan cardiac cath later today to exclude CAD. If PCI is indicated, this would appropriate given  his symptoms. Continue ASA, statin, beta blocker. EKG with inferior and anterolateral T wave inversions. Sinus.   2. HTN: BP controlled.   3. HLD: He is on a statin.   George Burns  3/24/201510:46 AM  

## 2013-12-27 NOTE — Progress Notes (Signed)
UR completed 

## 2013-12-28 DIAGNOSIS — E785 Hyperlipidemia, unspecified: Secondary | ICD-10-CM

## 2013-12-28 DIAGNOSIS — I10 Essential (primary) hypertension: Secondary | ICD-10-CM

## 2013-12-28 DIAGNOSIS — I200 Unstable angina: Secondary | ICD-10-CM

## 2013-12-28 DIAGNOSIS — R079 Chest pain, unspecified: Secondary | ICD-10-CM

## 2013-12-28 LAB — BASIC METABOLIC PANEL
BUN: 14 mg/dL (ref 6–23)
CHLORIDE: 108 meq/L (ref 96–112)
CO2: 23 meq/L (ref 19–32)
Calcium: 8.8 mg/dL (ref 8.4–10.5)
Creatinine, Ser: 1.1 mg/dL (ref 0.50–1.35)
GFR calc Af Amer: 82 mL/min — ABNORMAL LOW (ref >90)
GFR calc non Af Amer: 71 mL/min — ABNORMAL LOW (ref >90)
GLUCOSE: 103 mg/dL — AB (ref 70–99)
POTASSIUM: 4.7 meq/L (ref 3.7–5.3)
SODIUM: 142 meq/L (ref 137–147)

## 2013-12-28 LAB — CBC
HCT: 41.3 % (ref 39.0–52.0)
HEMOGLOBIN: 13.8 g/dL (ref 13.0–17.0)
MCH: 30.9 pg (ref 26.0–34.0)
MCHC: 33.4 g/dL (ref 30.0–36.0)
MCV: 92.6 fL (ref 78.0–100.0)
Platelets: 159 10*3/uL (ref 150–400)
RBC: 4.46 MIL/uL (ref 4.22–5.81)
RDW: 12.9 % (ref 11.5–15.5)
WBC: 7.4 10*3/uL (ref 4.0–10.5)

## 2013-12-28 MED ORDER — TICAGRELOR 90 MG PO TABS: 90.0000 mg | ORAL_TABLET | Freq: Two times a day (BID) | ORAL | Status: DC

## 2013-12-28 MED ORDER — TICAGRELOR 90 MG PO TABS
90.0000 mg | ORAL_TABLET | Freq: Two times a day (BID) | ORAL | Status: DC
Start: 2013-12-28 — End: 2013-12-28

## 2013-12-28 MED ORDER — NITROGLYCERIN 0.4 MG SL SUBL: 0.4000 mg | SUBLINGUAL_TABLET | SUBLINGUAL | Status: DC | PRN

## 2013-12-28 MED ORDER — ASPIRIN 81 MG PO TBEC: 81.0000 mg | DELAYED_RELEASE_TABLET | Freq: Every day | ORAL | Status: DC

## 2013-12-28 MED ORDER — METOPROLOL TARTRATE 25 MG PO TABS: 12.5000 mg | ORAL_TABLET | Freq: Two times a day (BID) | ORAL | Status: DC

## 2013-12-28 MED ORDER — ATORVASTATIN CALCIUM 80 MG PO TABS: 80.0000 mg | ORAL_TABLET | Freq: Every day | ORAL | Status: DC

## 2013-12-28 MED FILL — Sodium Chloride IV Soln 0.9%: INTRAVENOUS | Qty: 50 | Status: AC

## 2013-12-28 NOTE — Discharge Instructions (Signed)
KEEP WRIST CATHETERIZATION SITE CLEAN AND DRY. NO HEAVY LIFTING X 7 DAYS. NO DRIVING X 2-3 DAYS. NO SOAKING BATHS, HOT TUBS, POOLS, ETC., X 7 DAYS.  PLEASE SEE POST RADIAL CATH EDUCATION SHEET GIVEN

## 2013-12-28 NOTE — Progress Notes (Signed)
CARDIAC REHAB PHASE I   PRE:  Rate/Rhythm: 78 SR  BP:  Supine:   Sitting: 164/86  Standing:    SaO2:   MODE:  Ambulation: 1000 ft   POST:  Rate/Rhythm: 89 SR  BP:  Supine:   Sitting: 160/103 rechecked after 5 minutes138/82  Standing:    SaO2:  0750-0900 Pt tolerated ambulation well without c/o of cp or SOB. Pt has fast pace walk. BP up before and after walk. Completed discharge education with pt. He voices understanding. Pt declines Outpt. CRP due to work hours.  Melina CopaLisa Loraine Freid RN 12/28/2013 9:04 AM

## 2013-12-28 NOTE — Progress Notes (Signed)
Subjective: Feels good  Objective: Vital signs in last 24 hours: Temp:  [97.7 F (36.5 C)-98.1 F (36.7 C)] 98.1 F (36.7 C) (03/25 0010) Pulse Rate:  [56-67] 64 (03/25 0010) Resp:  [16-18] 18 (03/25 0010) BP: (110-138)/(67-74) 138/74 mmHg (03/25 0010) SpO2:  [97 %-100 %] 99 % (03/25 0010) Weight:  [177 lb 7.5 oz (80.5 kg)] 177 lb 7.5 oz (80.5 kg) (03/25 0010)    Intake/Output from previous day: 03/24 0701 - 03/25 0700 In: 554.2 [I.V.:554.2] Out: 900 [Urine:900] Intake/Output this shift: Total I/O In: 554.2 [I.V.:554.2] Out: 500 [Urine:500]  Medications Current Facility-Administered Medications  Medication Dose Route Frequency Provider Last Rate Last Dose  . 0.9 %  sodium chloride infusion  250 mL Intravenous PRN Belkys A Regalado, MD      . 0.9 %  sodium chloride infusion   Intravenous Continuous Lennette Bihari, MD 100 mL/hr at 12/27/13 2000    . acetaminophen (TYLENOL) tablet 650 mg  650 mg Oral Q6H PRN Belkys A Regalado, MD       Or  . acetaminophen (TYLENOL) suppository 650 mg  650 mg Rectal Q6H PRN Belkys A Regalado, MD      . aspirin EC tablet 81 mg  81 mg Oral Daily Lennette Bihari, MD      . atorvastatin (LIPITOR) tablet 80 mg  80 mg Oral q1800 Lennette Bihari, MD   80 mg at 12/27/13 1812  . enoxaparin (LOVENOX) injection 40 mg  40 mg Subcutaneous Q24H Rhonda G Barrett, PA-C   40 mg at 12/26/13 1702  . HYDROcodone-acetaminophen (NORCO/VICODIN) 5-325 MG per tablet 1-2 tablet  1-2 tablet Oral Q4H PRN Belkys A Regalado, MD      . metoprolol tartrate (LOPRESSOR) tablet 12.5 mg  12.5 mg Oral BID Wilburt Finlay, PA-C   12.5 mg at 12/27/13 2129  . multivitamin with minerals tablet 1 tablet  1 tablet Oral Daily Belkys A Regalado, MD   1 tablet at 12/27/13 1107  . nitroGLYCERIN (NITROSTAT) SL tablet 0.4 mg  0.4 mg Sublingual Q5 min PRN Audree Camel, MD      . ondansetron (ZOFRAN) tablet 4 mg  4 mg Oral Q6H PRN Belkys A Regalado, MD       Or  . ondansetron (ZOFRAN)  injection 4 mg  4 mg Intravenous Q6H PRN Belkys A Regalado, MD      . sodium chloride 0.9 % injection 3 mL  3 mL Intravenous Q12H Belkys A Regalado, MD      . sodium chloride 0.9 % injection 3 mL  3 mL Intravenous Q12H Belkys A Regalado, MD   3 mL at 12/26/13 2019  . sodium chloride 0.9 % injection 3 mL  3 mL Intravenous PRN Belkys A Regalado, MD      . Ticagrelor (BRILINTA) tablet 90 mg  90 mg Oral BID Lennette Bihari, MD   90 mg at 12/27/13 2130    PE: General appearance: alert, cooperative and no distress Lungs: clear to auscultation bilaterally Heart: regular rate and rhythm, S1, S2 normal, no murmur, click, rub or gallop Extremities: No LEE Pulses: 2+ and symmetric Skin: Warm and dry Neurologic: Grossly normal  Lab Results:   Recent Labs  12/26/13 1145 12/27/13 0237 12/28/13 0315  WBC 8.1 6.3 7.4  HGB 16.5 14.8 13.8  HCT 47.3 44.0 41.3  PLT 193 173 159   BMET  Recent Labs  12/27/13 0237 12/27/13 1845 12/28/13 0315  NA 144 141 142  K 5.5* 3.6* 4.7  CL 107 105 108  CO2 26 22 23   GLUCOSE 107* 137* 103*  BUN 17 12 14   CREATININE 1.07 0.97 1.10  CALCIUM 9.1 8.8 8.8   PT/INR  Recent Labs  12/27/13 0530  LABPROT 12.8  INR 0.98   Cholesterol  Recent Labs  12/27/13 0237  CHOL 192   Lipid Panel     Component Value Date/Time   CHOL 192 12/27/2013 0237   TRIG 125 12/27/2013 0237   HDL 44 12/27/2013 0237   CHOLHDL 4.4 12/27/2013 0237   VLDL 25 12/27/2013 0237   LDLCALC 123* 12/27/2013 0237     Cardiac Panel (last 3 results)  Recent Labs  12/26/13 1537 12/26/13 2008 12/27/13 0237  TROPONINI <0.30 <0.30 <0.30   HEMODYNAMICS:  Central Aorta: 114/66  Left Ventricle: 114/14  ANGIOGRAPHY:  Left Main is radiographically normal and bifurcated into a large LAD and left circumflex coronary artery.  The left anterior descending artery gave rise to prominent proximal diagonal vessel and a prominent proximal septal perforating artery. Immediately beyond the  septal perforator artery the LAD was 99% stenosed. There was TIMI-3 flow. The LAD extended to and wrapped around the apex.  Left circumflex vessel is angiographically normal as do 2 proximal marginal vessels and a more distal marginal branch.  The RCA was a very large caliber dominant vessel which gave rise to a very large PDA and posterolateral system.  RAO left ventriculography revealed an ejection fraction of 50%. There was mild mid distal anterolateral hypocontractility.  Following successful percutaneous coronary intervention to the LAD with PTCA and ultimate stenting with a 3.5x16 mm Promus Premier DES stent, postdilated to 3.71 mm, the 99% stenosis was reduced to 0%.  IMPRESSION:  Low normal to mild LV dysfunction with mild anterolateral hypocontractility and ejection fraction of 50%  99% stenosis in the proximal to mid LAD just beyond a moderate size septal perforating artery  Normal left circumflex and normal large dominant right coronary artery  Successful percutaneous coronary intervention to the LAD with the 99% stenosis being reduced to 0% with ultimate insertion of a 3.5x16 mm Promus Premier DES stent postdilated 3.71 mm  Angiomax/180 mg oral brilinta/IC nitroglycerin  Lennette Biharihomas A. Hendel Gatliff, MD, [facility]   Assessment/Plan  Principal Problem:   Stable angina Active Problems:   Hyperlipidemia   History of smoking   Hypertension   Intermediate coronary syndrome   Unstable angina  Plan:  SP LHC revealing 99% mid LAD.  Successful PCI with Promus Premier DES.  ASA, Brilinta.  EF 50%.   Potassium mildly high.  Other labs stable.  No MI.  BP looks good-lopressor, lipitor. Will arrange follow up.  DC home today.   LOS: 2 days    HAGER, BRYAN PA-C 12/28/2013 6:03 AM    Patient seen and examined. Agree with assessment and plan. Feels great. Ambulating without chest pain. NSR. T wave inversion is still present but less prominent in 3. avF and V3-6 today. Consider titrating lopressor to 25  mg bid.  DC today.   Lennette Biharihomas A. Reford Olliff, MD, [facility] 12/28/2013 7:53 AM

## 2013-12-28 NOTE — Discharge Summary (Signed)
Physician Discharge Summary     Patient ID: George Burns MRN: [id number] DOB/AGE: 61-Mar-1952 61 y.o. Cardiologist:  [place](new)  Admit date: 12/26/2013 Discharge date: 12/28/2013  Admission Diagnoses: Stable angina  Discharge Diagnoses:  Principal Problem:   Stable angina Active Problems:   Hyperlipidemia   History of smoking   Hypertension   Intermediate coronary syndrome   Unstable angina   Discharged Condition: stable  Hospital Course:   The patient is a 63 yo male from [place], who works in the [employer] industry, with a history of HTN and gout. He usually walks about three miles on the weekend with his wife and is job is very physical. This weekend, while walking, he notice every time he walked fast or up hill he developed CP with radiation to the left shoulder, diaphoresis, and SOB. He was very tired and could not keep up with his wife when he usually does not have a problem. The patient currently denies nausea, vomiting, fever, orthopnea, dizziness, PND, cough, congestion, abdominal pain, hematochezia, melena, lower extremity edema, claudication. He went to his PCP who recommended he go to the hospital.   The patient was admitted with acute T wave changes by [facility].  He ruled out for MI.  Lopressor 12.5mg  was added.  He wen to the cath lab and angiography revealed 99% stenosis in the proximal to mid LAD just beyond a moderate size septal perforating artery.  He underwent successful percutaneous coronary intervention to the LAD with the 99% stenosis being reduced to 0% with ultimate insertion of a 3.5x16 mm Promus Premier DES stent postdilated 3.71 mm.  EF 50%.  Asa, brilinta and statin added.  The patient was seen by Dr.  Tresa EndoKelly who felt he was stable for DC home.  Follow up arranged.     Consults:none  Significant Diagnostic Studies: LHC PROCEDURE:  The patient was brought to the second floor [place] [facility] cath lab in the postabsorptive state. He was  premedicated with Versed 2 mg and fentanyl 50 mcg. Right radial access was utilized after a Allen's test was positive. The right radial artery was punctured by the Seldinger technique and a 6 [place] glidesheath slender was inserted without difficulty. A radial cocktail consisting of lidocaine, IV nitroglycerin and verapamil was administered. Heparin 4000 units was administered after the safety J-wire was advanced to the ascending aorta. Initially, a 5 [place] FR4 catheter was inserted but this was unsuccessful at cannulating the RCA. A 5 French FL3.5 catheter was used for diagnostic angiography and the left coronary system. A 5 [place] EZRad Catheter was used to selectively cannulate the RCA. A 5 French pigtail catheter was used for RAO ventriculography. With the demonstration of a subtotal LAD stenosis the findings were discussed with the patient and PCI was performed. Angiomax bolus plus infusion was administered and the patient received 180 mg of brilinta. A 6 French XB 3.5 LAD was inserted and after documentation of therapeutic anticoagulation a pro-water wire was advanced down the LAD system. Predilatation was done with a 2.5x12 mm sprinter lesion balloon at 6 and 10 atmospheres. A 3.5x16 mm Promus Premier DES stent was then inserted in deployed at 12 and 13 atmospheres. Post stent dilatation was done with a 3.75x12 mm [place] Trek. Scout angiography confirmed an excellent angiographic result with a 99% stenosis being reduced to 0%. A TR band was applied at 15:00 and with 12 cc of air. He left the catheterization laboratory chest pain-free with stable hemodynamics.  HEMODYNAMICS:  Central Aorta:  114/66  Left Ventricle: 114/14  ANGIOGRAPHY:  Left Main is radiographically normal and bifurcated into a large LAD and left circumflex coronary artery.  The left anterior descending artery gave rise to prominent proximal diagonal vessel and a prominent proximal septal perforating artery. Immediately beyond the septal  perforator artery the LAD was 99% stenosed. There was TIMI-3 flow. The LAD extended to and wrapped around the apex.  Left circumflex vessel is angiographically normal as do 2 proximal marginal vessels and a more distal marginal branch.  The RCA was a very large caliber dominant vessel which gave rise to a very large PDA and posterolateral system.  RAO left ventriculography revealed an ejection fraction of 50%. There was mild mid distal anterolateral hypocontractility.  Following successful percutaneous coronary intervention to the LAD with PTCA and ultimate stenting with a 3.5x16 mm Promus Premier DES stent, postdilated to 3.71 mm, the 99% stenosis was reduced to 0%.  IMPRESSION:  Low normal to mild LV dysfunction with mild anterolateral hypocontractility and ejection fraction of 50%  99% stenosis in the proximal to mid LAD just beyond a moderate size septal perforating artery  Normal left circumflex and normal large dominant right coronary artery  Successful percutaneous coronary intervention to the LAD with the 99% stenosis being reduced to 0% with ultimate insertion of a 3.5x16 mm Promus Premier DES stent postdilated 3.71 mm  Angiomax/180 mg oral brilinta/IC nitroglycerin  Lennette Bihari, MD, [facility]  12/27/2013  Lipid Panel     Component Value Date/Time   CHOL 192 12/27/2013 0237   TRIG 125 12/27/2013 0237   HDL 44 12/27/2013 0237   CHOLHDL 4.4 12/27/2013 0237   VLDL 25 12/27/2013 0237   LDLCALC 123* 12/27/2013 0237    Cardiac Panel (last 3 results)  Recent Labs  12/26/13 1537 12/26/13 2008 12/27/13 0237  TROPONINI <0.30 <0.30 <0.30    Treatments: See above  Discharge Exam: Blood pressure 164/86, pulse 83, temperature 97.3 F (36.3 C), temperature source Oral, resp. rate 18, height 5\' 6"  (1.676 m), weight 177 lb 7.5 oz (80.5 kg), SpO2 96.00%.   Disposition: Final discharge disposition not confirmed      Discharge Orders   Future Appointments Provider Department Dept Phone    01/16/2014 12:15 PM Dyann Kief, PA-C [facility] [phone]   Future Orders Complete By Expires   Diet - low sodium heart healthy  As directed    Discharge instructions  As directed    Comments:     No lifting with your right arm for three days.   Increase activity slowly  As directed        Medication List         aspirin 81 MG EC tablet  Take 1 tablet (81 mg total) by mouth daily.     atorvastatin 80 MG tablet  Commonly known as:  LIPITOR  Take 1 tablet (80 mg total) by mouth daily at 6 PM.     B-complex with vitamin C tablet  Take 1 tablet by mouth daily.     FLAX SEED OIL PO  Take 1 tablet by mouth daily.     metoprolol tartrate 25 MG tablet  Commonly known as:  LOPRESSOR  Take 0.5 tablets (12.5 mg total) by mouth 2 (two) times daily.     multivitamin with minerals tablet  Take 1 tablet by mouth daily.     nitroGLYCERIN 0.4 MG SL tablet  Commonly known as:  NITROSTAT  Place 1 tablet (0.4  mg total) under the tongue every 5 (five) minutes as needed for chest pain.     Ticagrelor 90 MG Tabs tablet  Commonly known as:  BRILINTA  Take 1 tablet (90 mg total) by mouth 2 (two) times daily.     VITAMIN E PO  Take 1 tablet by mouth daily.       Follow-up Information   Follow up with Wende Bushy On 01/16/2014. (12:15)    Contact information:   [address] [place] [address] [phone]      Signed: Wilburt Finlay 12/28/2013, 9:30 AM

## 2013-12-28 NOTE — Care Management Note (Signed)
    Page 1 of 1   12/28/2013     8:58:16 AM   CARE MANAGEMENT NOTE 12/28/2013  Patient:  George Burns,George Burns   Account Number:  [id number]  Date Initiated:  12/28/2013  Documentation initiated by:  [facility],Mekel Haverstock  Subjective/Objective Assessment:   63 y.o. male With PMH significant for HTN, his BP has been controlled off mediations, Hyperlipidemia, who presents complaining of CP that started day prior to admission.//Home with spouse.     Action/Plan:   Left cardiac catheterization and possible percutaneous coronary intervention.//Benefits check for Brilinta.   Anticipated DC Date:  12/28/2013   Anticipated DC Plan:  HOME/SELF CARE      DC Planning Services  CM consult      Choice offered to / List presented to:             Status of service:  Completed, signed off Medicare Important Message given?   (If response is "NO", the following Medicare IM given date fields will be blank) Date Medicare IM given:   Date Additional Medicare IM given:    Discharge Disposition:    Per UR Regulation:    If discussed at Long Length of Stay Meetings, dates discussed:    Comments:  12/28/13 0840 Oletta Cohnamellia Anicka Stuckert, RN, BSN, NCM [phone] Spoke with pt at bedside regarding benefits check for Brilinta.  Pt has brochure with 30 day free card and refill assistance card intact.  Pt utilizes [facility] [address] for prescription needs.  NCM called pharmacy to confirm availability of medication.  Information relayed to pt.  Pt verbalizes importance of filling medication upon discharge.

## 2014-01-16 ENCOUNTER — Ambulatory Visit (INDEPENDENT_AMBULATORY_CARE_PROVIDER_SITE_OTHER): Payer: BC Managed Care – PPO | Admitting: Physician Assistant

## 2014-01-16 ENCOUNTER — Encounter: Payer: Self-pay | Admitting: Physician Assistant

## 2014-01-16 VITALS — BP 129/75 | HR 71 | Ht 66.0 in | Wt 166.8 lb

## 2014-01-16 DIAGNOSIS — I25810 Atherosclerosis of coronary artery bypass graft(s) without angina pectoris: Secondary | ICD-10-CM

## 2014-01-16 DIAGNOSIS — I2510 Atherosclerotic heart disease of native coronary artery without angina pectoris: Secondary | ICD-10-CM

## 2014-01-16 DIAGNOSIS — I10 Essential (primary) hypertension: Secondary | ICD-10-CM

## 2014-01-16 DIAGNOSIS — E785 Hyperlipidemia, unspecified: Secondary | ICD-10-CM

## 2014-01-16 MED ORDER — TICAGRELOR 90 MG PO TABS: 90.0000 mg | ORAL_TABLET | Freq: Two times a day (BID) | ORAL | Status: DC

## 2014-01-16 NOTE — Assessment & Plan Note (Signed)
stable °

## 2014-01-16 NOTE — Progress Notes (Signed)
HPI:  This is a 63 year old male patient from [place] patient of Dr. Clifton JamesMcAlhany who was admitted with unstable angina and negative cardiac enzymes. He underwent cardiac catheterization by Dr. Nicholaus BloomKelley and had drug-eluting stent to the LAD, normal circumflex, normal RCA and low normal to mild LV dysfunction with mild anterolateral hypocontractility EF 50%.  Since discharge the patient has done well without recurrent chest pain. He is walking every day without difficulty. He says his biggest trouble is when he travels for work and trying to watch his diet. His work colleagues typically eat fast food and he is struggling with this. The patient also complains of bruising with the Brilinta he has 3 bruises on his left arm and on his right leg. He says he has not bumped anything. He denies any melena, blood in his stools, or nosebleeds.  No Known Allergies  Current Outpatient Prescriptions on File Prior to Visit: aspirin EC 81 MG EC tablet, Take 1 tablet (81 mg total) by mouth daily., Disp: , Rfl:  atorvastatin (LIPITOR) 80 MG tablet, Take 1 tablet (80 mg total) by mouth daily at 6 PM., Disp: 30 tablet, Rfl: 5 B Complex-C (B-COMPLEX WITH VITAMIN C) tablet, Take 1 tablet by mouth daily., Disp: , Rfl:  Flaxseed, Linseed, (FLAX SEED OIL PO), Take 1 tablet by mouth daily., Disp: , Rfl:  metoprolol tartrate (LOPRESSOR) 25 MG tablet, Take 0.5 tablets (12.5 mg total) by mouth 2 (two) times daily., Disp: 60 tablet, Rfl: 5 Multiple Vitamins-Minerals (MULTIVITAMIN WITH MINERALS) tablet, Take 1 tablet by mouth daily., Disp: , Rfl:  nitroGLYCERIN (NITROSTAT) 0.4 MG SL tablet, Place 1 tablet (0.4 mg total) under the tongue every 5 (five) minutes as needed for chest pain., Disp: 25 tablet, Rfl: 12 VITAMIN E PO, Take 1 tablet by mouth daily., Disp: , Rfl:   No current facility-administered medications on file prior to visit.   Past Medical History:   Hypertension                                                 Gout                                                          Intermediate coronary syndrome                              No past surgical history on file.  Review of patient's family history indicates:   Heart attack                   Mother                   Cancer                         Father                   Social History   Marital Status: Married             Spouse Name:  Years of Education:                 Number of children:             Occupational History   None on file  Social History Main Topics   Smoking Status: Never Smoker                     Smokeless Status: Not on file                      Alcohol Use: Yes               Comment: occasional   Drug Use: No             Sexual Activity: Not on file        Other Topics            Concern   None on file  Social History Narrative   None on file    ROS: See history of present illness otherwise negative   PHYSICAL EXAM: Well-nournished, in no acute distress. Neck: No JVD, HJR, Bruit, or thyroid enlargement  Lungs: No tachypnea, clear without wheezing, rales, or rhonchi  Cardiovascular: RRR, PMI not displaced, heart sounds normal, no murmurs, gallops, bruit, thrill, or heave.  Abdomen: BS normal. Soft without organomegaly, masses, lesions or tenderness.  Extremities: Right arm without hematoma or hemorrhage at cath site, good radial and brachial pulses. Otherwise lower extremities without cyanosis, clubbing or edema. 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

## 2014-01-16 NOTE — Patient Instructions (Addendum)
Your physician recommends that you return for a FASTING lipid profile, LFT in 1 month   Your physician recommends that you schedule a follow-up appointment in: 2 months with DR. McAlhany  Your physician recommends that you continue on your current medications as directed. Please refer to the Current Medication list given to you today.

## 2014-01-16 NOTE — Assessment & Plan Note (Signed)
Status post DES to the LAD. EF 50%. Doing well without chest pain. Continue Brilinta.

## 2014-01-16 NOTE — Assessment & Plan Note (Signed)
Check lipids and LFT's in 4 weeks.Continue low fat diet.

## 2014-01-18 ENCOUNTER — Encounter: Payer: Self-pay | Admitting: Cardiovascular Disease

## 2014-02-01 ENCOUNTER — Telehealth: Payer: Self-pay | Admitting: Cardiovascular Disease

## 2014-02-01 DIAGNOSIS — I2510 Atherosclerotic heart disease of native coronary artery without angina pectoris: Secondary | ICD-10-CM

## 2014-02-01 NOTE — Telephone Encounter (Signed)
Spoke with pt's wife who reports pt is bruising easily. He has stopped all over the counter medications except ASA but is taking all prescription medications. Wife reports pt noted blood on tissue when wiping his nose on 4/27.  This is continuing when he wipes his nose but no other active bleeding.  Wife reports pt with bruising on his lower abdomen, legs, hips and both arms.  She states these are about the size of her fist. Bruising noted when he saw Jacolyn ReedyMichele Lenze, PA recently. Wife states bruising has gotten worse since office visit. She reports some of the bruising will go away but then other bruises will appear.

## 2014-02-01 NOTE — Telephone Encounter (Signed)
Reviewed with George ReedyMichele Lenze, PA and pt should continue ASA and Brilinta and have BMP and CBC checked.  I spoke with wife and gave her this information. Pt is out of town and expected back Thursday evening but may have to stay until Friday.  He will plan on coming in for lab work on Friday. He will come in on Monday if he does not arrive home until Friday evening. He is having no bleeding in bowel movements or any other obvious bleeding.

## 2014-02-01 NOTE — Telephone Encounter (Signed)
Agree. cdm 

## 2014-02-01 NOTE — Telephone Encounter (Signed)
New message     Pt is on 81mg  aspirin but noticing he is bruising easily.  He stopped all over-the-counter medications and is only taking his prescription medication and aspirin.  Should he continue taking the aspirin?

## 2014-02-03 ENCOUNTER — Other Ambulatory Visit (INDEPENDENT_AMBULATORY_CARE_PROVIDER_SITE_OTHER): Payer: BC Managed Care – PPO

## 2014-02-03 ENCOUNTER — Other Ambulatory Visit: Payer: BC Managed Care – PPO

## 2014-02-03 DIAGNOSIS — I10 Essential (primary) hypertension: Secondary | ICD-10-CM

## 2014-02-03 DIAGNOSIS — I2510 Atherosclerotic heart disease of native coronary artery without angina pectoris: Secondary | ICD-10-CM

## 2014-02-03 LAB — CBC WITH DIFFERENTIAL/PLATELET
BASOS ABS: 0 10*3/uL (ref 0.0–0.1)
Basophils Relative: 0.5 % (ref 0.0–3.0)
EOS ABS: 0.5 10*3/uL (ref 0.0–0.7)
Eosinophils Relative: 7.4 % — ABNORMAL HIGH (ref 0.0–5.0)
HEMATOCRIT: 43.3 % (ref 39.0–52.0)
Hemoglobin: 14.6 g/dL (ref 13.0–17.0)
LYMPHS ABS: 1.2 10*3/uL (ref 0.7–4.0)
LYMPHS PCT: 18.9 % (ref 12.0–46.0)
MCHC: 33.8 g/dL (ref 30.0–36.0)
MCV: 90.7 fl (ref 78.0–100.0)
Monocytes Absolute: 0.6 10*3/uL (ref 0.1–1.0)
Monocytes Relative: 10.3 % (ref 3.0–12.0)
NEUTROS ABS: 3.9 10*3/uL (ref 1.4–7.7)
Neutrophils Relative %: 62.9 % (ref 43.0–77.0)
Platelets: 213 10*3/uL (ref 150.0–400.0)
RBC: 4.77 Mil/uL (ref 4.22–5.81)
RDW: 13.3 % (ref 11.5–14.6)
WBC: 6.2 10*3/uL (ref 4.5–10.5)

## 2014-02-03 LAB — LIPID PANEL
Cholesterol: 100 mg/dL (ref 0–200)
HDL: 49.2 mg/dL (ref >39.00)
LDL Cholesterol: 41 mg/dL (ref 0–99)
TRIGLYCERIDES: 50 mg/dL (ref 0.0–149.0)
Total CHOL/HDL Ratio: 2
VLDL: 10 mg/dL (ref 0.0–40.0)

## 2014-02-03 LAB — BASIC METABOLIC PANEL
BUN: 11 mg/dL (ref 6–23)
CO2: 28 mEq/L (ref 19–32)
CREATININE: 0.9 mg/dL (ref 0.4–1.5)
Calcium: 9.5 mg/dL (ref 8.4–10.5)
Chloride: 106 mEq/L (ref 96–112)
GFR: 93.6 mL/min (ref >60.00)
Glucose, Bld: 104 mg/dL — ABNORMAL HIGH (ref 70–99)
Potassium: 4.6 mEq/L (ref 3.5–5.1)
Sodium: 140 mEq/L (ref 135–145)

## 2014-02-03 LAB — HEPATIC FUNCTION PANEL
ALT: 94 U/L — ABNORMAL HIGH (ref 0–53)
AST: 53 U/L — ABNORMAL HIGH (ref 0–37)
Albumin: 4.2 g/dL (ref 3.5–5.2)
Alkaline Phosphatase: 268 U/L — ABNORMAL HIGH (ref 39–117)
BILIRUBIN TOTAL: 0.8 mg/dL (ref 0.3–1.2)
Bilirubin, Direct: 0.2 mg/dL (ref 0.0–0.3)
Total Protein: 7.4 g/dL (ref 6.0–8.3)

## 2014-02-06 ENCOUNTER — Other Ambulatory Visit: Payer: Self-pay | Admitting: *Deleted

## 2014-02-06 DIAGNOSIS — E785 Hyperlipidemia, unspecified: Secondary | ICD-10-CM

## 2014-02-13 ENCOUNTER — Other Ambulatory Visit: Payer: BC Managed Care – PPO

## 2014-02-24 ENCOUNTER — Encounter: Payer: Self-pay | Admitting: Cardiovascular Disease

## 2014-03-07 ENCOUNTER — Other Ambulatory Visit (INDEPENDENT_AMBULATORY_CARE_PROVIDER_SITE_OTHER): Payer: BC Managed Care – PPO

## 2014-03-07 DIAGNOSIS — E785 Hyperlipidemia, unspecified: Secondary | ICD-10-CM

## 2014-03-07 LAB — HEPATIC FUNCTION PANEL
ALBUMIN: 4.1 g/dL (ref 3.5–5.2)
ALT: 141 U/L — ABNORMAL HIGH (ref 0–53)
AST: 74 U/L — AB (ref 0–37)
Alkaline Phosphatase: 188 U/L — ABNORMAL HIGH (ref 39–117)
Bilirubin, Direct: 0.2 mg/dL (ref 0.0–0.3)
Total Bilirubin: 0.8 mg/dL (ref 0.2–1.2)
Total Protein: 7 g/dL (ref 6.0–8.3)

## 2014-03-10 ENCOUNTER — Telehealth: Payer: Self-pay | Admitting: *Deleted

## 2014-03-10 ENCOUNTER — Other Ambulatory Visit: Payer: Self-pay | Admitting: *Deleted

## 2014-03-10 NOTE — Telephone Encounter (Signed)
Liver function tests reviewed by Dr. Clifton James who would like pt to stop Atorvastatin and review at follow up appt with Tereso Newcomer, PA on March 23, 2014. Result note from Dr. Clifton James did not show up in [facility] documentation. Pt's wife has been notified.

## 2014-03-20 ENCOUNTER — Ambulatory Visit: Payer: BC Managed Care – PPO | Admitting: Cardiovascular Disease

## 2014-03-23 ENCOUNTER — Ambulatory Visit (INDEPENDENT_AMBULATORY_CARE_PROVIDER_SITE_OTHER): Payer: BC Managed Care – PPO | Admitting: Physician Assistant

## 2014-03-23 ENCOUNTER — Encounter: Payer: Self-pay | Admitting: Physician Assistant

## 2014-03-23 ENCOUNTER — Other Ambulatory Visit: Payer: Self-pay | Admitting: Physician Assistant

## 2014-03-23 ENCOUNTER — Encounter (INDEPENDENT_AMBULATORY_CARE_PROVIDER_SITE_OTHER): Payer: Self-pay

## 2014-03-23 VITALS — BP 120/66 | HR 72 | Ht 66.0 in | Wt 157.0 lb

## 2014-03-23 DIAGNOSIS — R7989 Other specified abnormal findings of blood chemistry: Secondary | ICD-10-CM

## 2014-03-23 DIAGNOSIS — R945 Abnormal results of liver function studies: Principal | ICD-10-CM

## 2014-03-23 DIAGNOSIS — E785 Hyperlipidemia, unspecified: Secondary | ICD-10-CM

## 2014-03-23 DIAGNOSIS — I10 Essential (primary) hypertension: Secondary | ICD-10-CM

## 2014-03-23 DIAGNOSIS — I2510 Atherosclerotic heart disease of native coronary artery without angina pectoris: Secondary | ICD-10-CM

## 2014-03-23 NOTE — Patient Instructions (Addendum)
PLEASE FOLLOW UP WITH DR. VARANASI IN 3 MONTHS 06/27/14 3:30 PM  LAB WORK TODAY; CBC W/DIFF, LFT, HEPATITIS PANEL  YOU WILL NEED TO SCHEDULE AN ABDOMINAL ULTRASOUND ; DX ELEVATED LFT'S  STAY OFF THE LIPITOR

## 2014-03-23 NOTE — Progress Notes (Signed)
Cardiology Office Note    Date:  03/23/2014   ID:  George Burns, DOB [birth year], MRN [id number]  PCP:  Pcp Not In System  Cardiologist:  Dr. Everette RankJay Varanasi      History of Present Illness: George Burns is a 63 y.o. male with a hx of CAD s/p DES to LAD in 12/2013, HTN, HL.  Recent labs demonstrate significantly increased LFTs.  Lipitor has been stopped.  He returns for follow up.    He is seen with the assistance of an interpreter via telephone. He denies chest pain, significant dyspnea, orthopnea, PND or edema. He denies syncope.   Studies:  - LHC (12/27/13):  LAD 99%, EF 50%. PCI:  3.5 x 16 mm Promus premier DES to the LAD    Recent Labs: 02/03/2014: Creatinine 0.9; HDL Cholesterol by NMR 49.20; Hemoglobin 14.6; LDL (calc) 41; Potassium 4.6  03/07/2014: ALT 141*  ALT  Date/Time Value Ref Range Status  03/07/2014  7:38 AM 141* 0 - 53 U/L Final  02/03/2014  7:39 AM 94* 0 - 53 U/L Final     AST  Date/Time Value Ref Range Status  03/07/2014  7:38 AM 74* 0 - 37 U/L Final  02/03/2014  7:39 AM 53* 0 - 37 U/L Final    Wt Readings from Last 3 Encounters:  03/23/14 157 lb (71.215 kg)  01/16/14 166 lb 12.8 oz (75.66 kg)  12/28/13 177 lb 7.5 oz (80.5 kg)     Past Medical History  Diagnosis Date  . Hypertension   . Gout   . Intermediate coronary syndrome     Current Outpatient Prescriptions  Medication Sig Dispense Refill  . aspirin EC 81 MG EC tablet Take 1 tablet (81 mg total) by mouth daily.      . B Complex-C (B-COMPLEX WITH VITAMIN C) tablet Take 1 tablet by mouth daily.      . Flaxseed, Linseed, (FLAX SEED OIL PO) Take 1 tablet by mouth daily.      . metoprolol tartrate (LOPRESSOR) 25 MG tablet Take 0.5 tablets (12.5 mg total) by mouth 2 (two) times daily.  60 tablet  5  . Multiple Vitamins-Minerals (MULTIVITAMIN WITH MINERALS) tablet Take 1 tablet by mouth daily.      . nitroGLYCERIN (NITROSTAT) 0.4 MG SL tablet Place 1 tablet (0.4 mg total) under the tongue  every 5 (five) minutes as needed for chest pain.  25 tablet  12  . Ticagrelor (BRILINTA) 90 MG TABS tablet Take 1 tablet (90 mg total) by mouth 2 (two) times daily.  180 tablet  2  . VITAMIN E PO Take 1 tablet by mouth daily.       No current facility-administered medications for this visit.    Allergies:   Review of patient's allergies indicates no known allergies.   Social History:  The patient  reports that he has never smoked. He does not have any smokeless tobacco history on file. He reports that he drinks alcohol. He reports that he does not use illicit drugs.   Family History:  The patient's family history includes Cancer in his father; Heart attack in his mother.   ROS:  Please see the history of present illness.   He denies abdominal pain. He does note dark stools. He denies hematochezia.   All other systems reviewed and negative.   PHYSICAL EXAM: VS:  BP 120/66  Pulse 72  Ht 5\' 6"  (1.676 m)  Wt 157 lb (71.215 kg)  BMI 25.35 kg/m2  Well nourished, well developed, in no acute distress HEENT: normal Neck: no JVD Cardiac:  normal S1, S2; RRR; no murmur Lungs:  clear to auscultation bilaterally, no wheezing, rhonchi or rales Abd: soft, nontender, no hepatomegaly Ext: no edema Skin: warm and dry Neuro:  CNs 2-12 intact, no focal abnormalities noted  EKG:  NSR, HR 72, LAD, no significant change when compared to prior tracing     ASSESSMENT AND PLAN:  1. Elevated LFTs:  Etiology not entirely clear. He remains off of Lipitor. He denies a history of excessive alcohol use. He denies IV drug abuse. He does not have any tattoos. He has never spent any time in prison. I will obtain repeat LFTs today. I will obtain a hepatitis panel. I will obtain an abdominal ultrasound. He may require referral to gastroenterology. 2. Coronary artery disease:  No angina. Continue aspirin, Brilinta, beta blocker. 3. Hyperlipidemia:  Stay off of Lipitor given recent elevated LFTs. 4. Essential  hypertension:  Controlled. 5. Disposition: Follow up with Dr. Everette RankJay Varanasi in 3 mos.    Signed, Brynda RimScott Weaver, PA-C, MHS 03/23/2014 3:37 PM    [facility] [address], [place], [place]  [address] Phone: [phone]; Fax: [phone]

## 2014-03-24 ENCOUNTER — Telehealth: Payer: Self-pay | Admitting: *Deleted

## 2014-03-24 LAB — HEPATITIS PANEL, ACUTE
HCV Ab: NEGATIVE
HEP A IGM: NONREACTIVE
HEP B S AG: NEGATIVE
Hep B C IgM: NONREACTIVE

## 2014-03-24 LAB — CBC WITH DIFFERENTIAL/PLATELET
Basophils Absolute: 0 10*3/uL (ref 0.0–0.1)
Basophils Relative: 0.8 % (ref 0.0–3.0)
Eosinophils Absolute: 0.2 10*3/uL (ref 0.0–0.7)
Eosinophils Relative: 3.1 % (ref 0.0–5.0)
HEMATOCRIT: 43.7 % (ref 39.0–52.0)
HEMOGLOBIN: 14.6 g/dL (ref 13.0–17.0)
LYMPHS ABS: 1.5 10*3/uL (ref 0.7–4.0)
Lymphocytes Relative: 24.4 % (ref 12.0–46.0)
MCHC: 33.4 g/dL (ref 30.0–36.0)
MCV: 90.5 fl (ref 78.0–100.0)
MONOS PCT: 8.8 % (ref 3.0–12.0)
Monocytes Absolute: 0.5 10*3/uL (ref 0.1–1.0)
Neutro Abs: 3.8 10*3/uL (ref 1.4–7.7)
Neutrophils Relative %: 62.9 % (ref 43.0–77.0)
PLATELETS: 178 10*3/uL (ref 150.0–400.0)
RBC: 4.84 Mil/uL (ref 4.22–5.81)
RDW: 14.4 % (ref 11.5–15.5)
WBC: 6.1 10*3/uL (ref 4.0–10.5)

## 2014-03-24 LAB — HEPATIC FUNCTION PANEL
ALBUMIN: 5 g/dL (ref 3.5–5.2)
ALK PHOS: 120 U/L — AB (ref 39–117)
ALT: 60 U/L — ABNORMAL HIGH (ref 0–53)
AST: 32 U/L (ref 0–37)
BILIRUBIN TOTAL: 0.5 mg/dL (ref 0.2–1.2)
Bilirubin, Direct: 0.1 mg/dL (ref 0.0–0.3)
Indirect Bilirubin: 0.4 mg/dL (ref 0.2–1.2)
Total Protein: 7.5 g/dL (ref 6.0–8.3)

## 2014-03-24 NOTE — Telephone Encounter (Signed)
s/w pt's wife due to language barrier pt speaks polish. Wife verbalized understanding to lab results and said she will let pt know no hepatitis. I advised that once the abdominal U/S if done we will cb with results, wife said ok and thank you.

## 2014-03-27 ENCOUNTER — Telehealth: Payer: Self-pay | Admitting: *Deleted

## 2014-03-27 DIAGNOSIS — R945 Abnormal results of liver function studies: Principal | ICD-10-CM

## 2014-03-27 DIAGNOSIS — R7989 Other specified abnormal findings of blood chemistry: Secondary | ICD-10-CM

## 2014-03-27 NOTE — Telephone Encounter (Signed)
lmptcb for pt's wife tcb to go over lft function results. Pt does not speak [place].

## 2014-03-27 NOTE — Telephone Encounter (Signed)
pt's wife cb and has been notified about LFT results with verbal understanding. We scheduled pt for repeat lft 7/13 since they will be out town.

## 2014-03-28 ENCOUNTER — Ambulatory Visit (HOSPITAL_COMMUNITY)
Admission: RE | Admit: 2014-03-28 | Discharge: 2014-03-28 | Disposition: A | Discharge status: Home / Self Care | Payer: BC Managed Care – PPO | Source: Ambulatory Visit | Attending: Physician Assistant | Admitting: Physician Assistant

## 2014-03-28 DIAGNOSIS — R7989 Other specified abnormal findings of blood chemistry: Secondary | ICD-10-CM

## 2014-03-28 DIAGNOSIS — R945 Abnormal results of liver function studies: Secondary | ICD-10-CM

## 2014-03-28 DIAGNOSIS — Q619 Cystic kidney disease, unspecified: Secondary | ICD-10-CM | POA: Insufficient documentation

## 2014-03-31 ENCOUNTER — Telehealth: Payer: Self-pay | Admitting: *Deleted

## 2014-03-31 DIAGNOSIS — R945 Abnormal results of liver function studies: Principal | ICD-10-CM

## 2014-03-31 DIAGNOSIS — R7989 Other specified abnormal findings of blood chemistry: Secondary | ICD-10-CM

## 2014-03-31 NOTE — Telephone Encounter (Signed)
s/w pt's wife due to language barrier, pt speaks [place]. Wife aware of referral to GI and our office w/cb w/date and time of appt for pt. Wife verbalized Plan of Care.

## 2014-04-03 ENCOUNTER — Telehealth: Payer: Self-pay | Admitting: Internal Medicine

## 2014-04-03 NOTE — Telephone Encounter (Signed)
Spoke with Bonita QuinLinda at [facility] and they would like the pt seen in a month for elevated LFT's. Labs are being redrawn in about 1 week. Pt scheduled to see Dr. Leone PayorGessner 05/01/14@10 :15am. Pt aware of appt.

## 2014-04-17 ENCOUNTER — Telehealth: Payer: Self-pay | Admitting: *Deleted

## 2014-04-17 ENCOUNTER — Other Ambulatory Visit (INDEPENDENT_AMBULATORY_CARE_PROVIDER_SITE_OTHER): Payer: BC Managed Care – PPO

## 2014-04-17 DIAGNOSIS — R945 Abnormal results of liver function studies: Principal | ICD-10-CM

## 2014-04-17 DIAGNOSIS — R7989 Other specified abnormal findings of blood chemistry: Secondary | ICD-10-CM

## 2014-04-17 LAB — HEPATIC FUNCTION PANEL
ALBUMIN: 4.2 g/dL (ref 3.5–5.2)
ALK PHOS: 79 U/L (ref 39–117)
ALT: 45 U/L (ref 0–53)
AST: 32 U/L (ref 0–37)
BILIRUBIN DIRECT: 0 mg/dL (ref 0.0–0.3)
BILIRUBIN TOTAL: 0.7 mg/dL (ref 0.2–1.2)
Total Protein: 7 g/dL (ref 6.0–8.3)

## 2014-04-17 NOTE — Telephone Encounter (Signed)
pt's wife notified about lab results and for pt to make sure to keep upcoming new pt appt with GI. Wife said well if his lab work is better then why does he need to go. I stated need to find out why LFT were elevated, wife said ok and thank you.

## 2014-04-20 ENCOUNTER — Encounter: Payer: Self-pay | Admitting: Gastroenterology

## 2014-05-01 ENCOUNTER — Encounter: Payer: Self-pay | Admitting: Internal Medicine

## 2014-05-01 ENCOUNTER — Ambulatory Visit (INDEPENDENT_AMBULATORY_CARE_PROVIDER_SITE_OTHER): Payer: BC Managed Care – PPO | Admitting: Internal Medicine

## 2014-05-01 VITALS — BP 116/70 | HR 88 | Ht 65.25 in | Wt 154.0 lb

## 2014-05-01 DIAGNOSIS — R7401 Elevation of levels of liver transaminase levels: Secondary | ICD-10-CM

## 2014-05-01 DIAGNOSIS — R748 Abnormal levels of other serum enzymes: Secondary | ICD-10-CM

## 2014-05-01 DIAGNOSIS — R740 Nonspecific elevation of levels of transaminase and lactic acid dehydrogenase [LDH]: Secondary | ICD-10-CM

## 2014-05-01 DIAGNOSIS — R945 Abnormal results of liver function studies: Secondary | ICD-10-CM

## 2014-05-01 DIAGNOSIS — R7989 Other specified abnormal findings of blood chemistry: Secondary | ICD-10-CM

## 2014-05-01 DIAGNOSIS — Z86010 Personal history of colonic polyps: Secondary | ICD-10-CM

## 2014-05-01 NOTE — Progress Notes (Signed)
Subjective:    Patient ID: George Burns, male    DOB: [birth year], 63 y.o.   MRN: [id number]  HPI The patient is a 63 year old man originally from [place] who had a drug coronary stent placed a few months ago. He was placed on Lipitor. Liver function tests rose including transaminases and alkaline phosphatase, see labs below. They have suddenly return to normal after this medication was stopped. He is here with his wife she also provide a history. As best I can tell he has not had muscle aches or pains. He does not use alcohol except on rare Celebrex or indications. There were no other obvious risk factors for liver disease. GI review of systems is currently negative, he did have a colon[place] in [place] in 2005, there was a 5 mm adenoma removed. He has not yet had a colonoscopy for surveillance.  No Known Allergies Outpatient Prescriptions Prior to Visit  Medication Sig Dispense Refill  . aspirin EC 81 MG EC tablet Take 1 tablet (81 mg total) by mouth daily.      . B Complex-C (B-COMPLEX WITH VITAMIN C) tablet Take 1 tablet by mouth daily.      . metoprolol tartrate (LOPRESSOR) 25 MG tablet Take 0.5 tablets (12.5 mg total) by mouth 2 (two) times daily.  60 tablet  5  . nitroGLYCERIN (NITROSTAT) 0.4 MG SL tablet Place 1 tablet (0.4 mg total) under the tongue every 5 (five) minutes as needed for chest pain.  25 tablet  12  . Ticagrelor (BRILINTA) 90 MG TABS tablet Take 1 tablet (90 mg total) by mouth 2 (two) times daily.  180 tablet  2  . Flaxseed, Linseed, (FLAX SEED OIL PO) Take 1 tablet by mouth daily.      . Multiple Vitamins-Minerals (MULTIVITAMIN WITH MINERALS) tablet Take 1 tablet by mouth daily.      Marland Kitchen. VITAMIN E PO Take 1 tablet by mouth daily.       No facility-administered medications prior to visit.   Past Medical History  Diagnosis Date  . Hypertension   . Gout   . Intermediate coronary syndrome   . Renal cyst, left   . Elevated LFTs   . HLD (hyperlipidemia)     Past Surgical History  Procedure Laterality Date  . Coronary stent placement    . Colonoscopy     History   Social History  . Marital Status: Married    Spouse Name: N/A    Number of Children: 3  . Years of Education: N/A   Occupational History  . [employer]    Social History Main Topics  . Smoking status: Former Smoker    Types: Cigarettes    Quit date: 10/06/1984  . Smokeless tobacco: Never Used  . Alcohol Use: No  . 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 One son to daughters. One cup of coffee a day area and no alcohol.   Family History  Problem Relation Age of Onset  . Heart disease Mother   . Lung cancer Father     smoker  . Emphysema Brother      Review of Systems No chest pain or breathing problems. He is doing well at this time.    Objective:   Physical Exam General:  Well-developed, well-nourished and in no acute distress Eyes:  anicteric. Lungs: Clear to auscultation bilaterally. Heart:  S1S2, no rubs, murmurs, gallops. Abdomen:  soft,  non-tender, no hepatosplenomegaly, hernia, or mass and BS+.  Extremities:   no edema Skin   no stigmata of chronic liver disease Neuro:  A&O x 3.  Psych:  appropriate mood and  Affect.   Data Reviewed:  Results for George Burns, George Burns (MRN [id number]) as of 05/01/2014 17:24  Ref. Range 02/03/2014 07:39 03/07/2014 07:38 03/23/2014 16:31 04/17/2014 07:32  Alkaline Phosphatase Latest Range: 39-117 U/L 268 (H) 188 (H) 120 (H) 79  Albumin Latest Range: 3.5-5.2 g/dL 4.2 4.1 5.0 4.2  AST Latest Range: 0-37 U/L 53 (H) 74 (H) 32 32  ALT Latest Range: 0-53 U/L 94 (H) 141 (H) 60 (H) 45  Total Protein Latest Range: 6.0-8.3 g/dL 7.4 7.0 7.5 7.0  Bilirubin, Direct Latest Range: 0.0-0.3 mg/dL 0.2 0.2 0.1 0.0  Indirect Bilirubin Latest Range: 0.2-1.2 mg/dL   0.4   Total Bilirubin Latest Range: 0.2-1.2 mg/dL 0.8 0.8 0.5 0.7  Results for George Burns, George Burns (MRN  [id number]) as of 05/01/2014 17:24  Ref. Range 03/23/2014 16:31  Hep A IgM Latest Range: NON REACTIVE  NON REACTIVE  Hepatitis B Surface Ag Latest Range: NEGATIVE  NEGATIVE  Hep B C IgM Latest Range: NON REACTIVE  NON REACTIVE  HCV Ab Latest Range: NEGATIVE  NEGATIVE   Recent abdominal ultrasound in March 23 2014 did not reveal any significant abnormalities including liver.     Assessment & Plan:  Personal history of colonic polyp - adenoma He should have a repeat colonoscopy. Given his 5 mm adenoma 2005 he is currently due. Longer than typical but still within the range of 5-10 years. However because he is on Brilinta and had a drug-eluting stent I suspect that the cardiologist would not like him to stop this until March of 2016 so we'll place a recall for that time.  Elevated LFTs This seems most likely to be related to the Lipitor. He is better at this time with respect to normalization. I would try another lipid-lowering agent, it is not wrong to try another statin. I would watch his LFTs, I will also draw CK if they rise. No further workup needed at this time. Be happy to see him back.   I appreciate the opportunity to care for this patient. Please copy Tereso Newcomer [facility]

## 2014-05-01 NOTE — Assessment & Plan Note (Signed)
This seems most likely to be related to the Lipitor. He is better at this time with respect to normalization. I would try another lipid-lowering agent, it is not wrong to try another statin. I would watch his LFTs, I will also draw CK if they rise. No further workup needed at this time. Be happy to see him back.

## 2014-05-01 NOTE — Assessment & Plan Note (Signed)
He should have a repeat colonoscopy. Given his 5 mm adenoma 2005 he is currently due. Longer than typical but still within the range of 5-10 years. However because he is on Brilinta and had a drug-eluting stent I suspect that the cardiologist would not like him to stop this until March of 2016 so we'll place a recall for that time.

## 2014-05-01 NOTE — Patient Instructions (Signed)
Please make an appointment with cardiology to discuss trying a different lipid rx.  I appreciate the opportunity to care for you.

## 2014-05-05 ENCOUNTER — Telehealth: Payer: Self-pay | Admitting: Physician Assistant

## 2014-05-05 ENCOUNTER — Telehealth: Payer: Self-pay | Admitting: *Deleted

## 2014-05-05 DIAGNOSIS — E785 Hyperlipidemia, unspecified: Secondary | ICD-10-CM

## 2014-05-05 DIAGNOSIS — R945 Abnormal results of liver function studies: Secondary | ICD-10-CM

## 2014-05-05 DIAGNOSIS — I2510 Atherosclerotic heart disease of native coronary artery without angina pectoris: Secondary | ICD-10-CM

## 2014-05-05 DIAGNOSIS — R7989 Other specified abnormal findings of blood chemistry: Secondary | ICD-10-CM

## 2014-05-05 MED ORDER — PRAVASTATIN SODIUM 40 MG PO TABS: 40.0000 mg | ORAL_TABLET | Freq: Every evening | ORAL | Status: DC

## 2014-05-05 NOTE — Telephone Encounter (Signed)
pt's wife notified about lab results and to have pt start pravastatin 40 qhs, LFT 8/27, FLP/LFT 9/28. Wife verbalized Plan of Care and [facility] [address]

## 2014-05-05 NOTE — Telephone Encounter (Signed)
Reviewed note by Dr. Leone PayorGessner. Ok to try another statin. Start Pravastatin 40 mg QHS. Check LFTs 3-4 weeks after starting Pravastatin. Arrange Lipids and LFTs 8 weeks after starting. Tereso NewcomerScott Swain Ac[place], PA-C   05/05/2014 12:25 PM

## 2014-05-05 NOTE — Telephone Encounter (Signed)
pt's wife notified about lab results and to have pt start pravastatin 40 qhs, LFT 8/27, FLP/LFT 9/28. Wife verbalized Plan of Care and rx sent in to [facility] [address] 

## 2014-05-31 ENCOUNTER — Ambulatory Visit (INDEPENDENT_AMBULATORY_CARE_PROVIDER_SITE_OTHER): Payer: BC Managed Care – PPO | Admitting: *Deleted

## 2014-05-31 DIAGNOSIS — E785 Hyperlipidemia, unspecified: Secondary | ICD-10-CM

## 2014-05-31 DIAGNOSIS — I2510 Atherosclerotic heart disease of native coronary artery without angina pectoris: Secondary | ICD-10-CM

## 2014-05-31 DIAGNOSIS — R945 Abnormal results of liver function studies: Secondary | ICD-10-CM

## 2014-05-31 DIAGNOSIS — R7989 Other specified abnormal findings of blood chemistry: Secondary | ICD-10-CM

## 2014-05-31 LAB — HEPATIC FUNCTION PANEL
ALBUMIN: 4.2 g/dL (ref 3.5–5.2)
ALT: 40 U/L (ref 0–53)
AST: 31 U/L (ref 0–37)
Alkaline Phosphatase: 64 U/L (ref 39–117)
Bilirubin, Direct: 0 mg/dL (ref 0.0–0.3)
Total Bilirubin: 0.7 mg/dL (ref 0.2–1.2)
Total Protein: 7.3 g/dL (ref 6.0–8.3)

## 2014-06-01 ENCOUNTER — Telehealth: Payer: Self-pay | Admitting: *Deleted

## 2014-06-01 ENCOUNTER — Other Ambulatory Visit: Payer: BC Managed Care – PPO

## 2014-06-01 NOTE — Telephone Encounter (Signed)
pt's wife notified about normal lab results with verbal understanding, pt speaks [place] and does not understand [place] well.

## 2014-06-09 ENCOUNTER — Ambulatory Visit: Payer: BC Managed Care – PPO | Admitting: Internal Medicine

## 2014-06-27 ENCOUNTER — Ambulatory Visit: Payer: BC Managed Care – PPO | Admitting: Interventional Cardiology

## 2014-07-03 ENCOUNTER — Other Ambulatory Visit: Payer: Self-pay | Admitting: *Deleted

## 2014-07-03 ENCOUNTER — Other Ambulatory Visit (INDEPENDENT_AMBULATORY_CARE_PROVIDER_SITE_OTHER): Payer: BC Managed Care – PPO | Admitting: *Deleted

## 2014-07-03 DIAGNOSIS — Z79899 Other long term (current) drug therapy: Secondary | ICD-10-CM

## 2014-07-03 DIAGNOSIS — R945 Abnormal results of liver function studies: Secondary | ICD-10-CM

## 2014-07-03 DIAGNOSIS — E785 Hyperlipidemia, unspecified: Secondary | ICD-10-CM

## 2014-07-03 DIAGNOSIS — R7989 Other specified abnormal findings of blood chemistry: Secondary | ICD-10-CM

## 2014-07-03 DIAGNOSIS — I2510 Atherosclerotic heart disease of native coronary artery without angina pectoris: Secondary | ICD-10-CM

## 2014-07-03 LAB — LIPID PANEL
CHOLESTEROL: 151 mg/dL (ref 0–200)
HDL: 44 mg/dL (ref >39.00)
LDL CALC: 79 mg/dL (ref 0–99)
NonHDL: 107
TRIGLYCERIDES: 141 mg/dL (ref 0.0–149.0)
Total CHOL/HDL Ratio: 3
VLDL: 28.2 mg/dL (ref 0.0–40.0)

## 2014-07-03 LAB — HEPATIC FUNCTION PANEL
ALT: 30 U/L (ref 0–53)
AST: 25 U/L (ref 0–37)
Albumin: 4.2 g/dL (ref 3.5–5.2)
Alkaline Phosphatase: 60 U/L (ref 39–117)
Bilirubin, Direct: 0 mg/dL (ref 0.0–0.3)
Total Bilirubin: 0.7 mg/dL (ref 0.2–1.2)
Total Protein: 7.1 g/dL (ref 6.0–8.3)

## 2014-07-04 ENCOUNTER — Telehealth: Payer: Self-pay | Admitting: Interventional Cardiology

## 2014-07-04 NOTE — Telephone Encounter (Signed)
Returned call to wife with pts lab work results.

## 2014-07-04 NOTE — Telephone Encounter (Signed)
New message  ° ° °Patient wife calling for test results  °

## 2014-07-11 ENCOUNTER — Ambulatory Visit (INDEPENDENT_AMBULATORY_CARE_PROVIDER_SITE_OTHER): Payer: BC Managed Care – PPO | Admitting: Interventional Cardiology

## 2014-07-11 ENCOUNTER — Encounter: Payer: Self-pay | Admitting: Interventional Cardiology

## 2014-07-11 VITALS — BP 130/78 | HR 70 | Ht 67.0 in | Wt 153.6 lb

## 2014-07-11 DIAGNOSIS — I2510 Atherosclerotic heart disease of native coronary artery without angina pectoris: Secondary | ICD-10-CM

## 2014-07-11 DIAGNOSIS — E785 Hyperlipidemia, unspecified: Secondary | ICD-10-CM

## 2014-07-11 DIAGNOSIS — I951 Orthostatic hypotension: Secondary | ICD-10-CM

## 2014-07-11 DIAGNOSIS — R7989 Other specified abnormal findings of blood chemistry: Secondary | ICD-10-CM

## 2014-07-11 DIAGNOSIS — R945 Abnormal results of liver function studies: Secondary | ICD-10-CM

## 2014-07-11 MED ORDER — CLOPIDOGREL BISULFATE 75 MG PO TABS: 75.0000 mg | ORAL_TABLET | Freq: Every day | ORAL | Status: DC

## 2014-07-11 NOTE — Patient Instructions (Signed)
Your physician has recommended you make the following change in your medication:   1. Stop Brilinta once you run out of your current supply.   2. Start Plavix after you finish Brilinta.    -Day 1 of Plavix take 2 tablets    -Day 2 of Plavix take 2 tablets, then on day 3 start 1 tablet daily.   Your physician wants you to follow-up in: 6 months with Dr. Eldridge DaceVaranasi. You will receive a reminder letter in the mail two months in advance. If you don't receive a letter, please call our office to schedule the follow-up appointment.

## 2014-07-11 NOTE — Progress Notes (Signed)
Patient ID: MAJED PELLEGRIN, male   DOB: [birth year], 63 y.o.   MRN: [id number]    [address] [place], [place]  [address] Phone: [phone] Fax:  [phone]  Date:  07/11/2014   ID:  GLADYS GUTMAN, DOB [birth year], MRN [id number]  PCP:  Pcp Not In System      History of Present Illness: George Burns is a 63 y.o. male with a hx of CAD s/p DES to LAD in 12/2013, HTN, HL. Recent labs demonstrate significantly increased LFTs. Lipitor has been stopped. He returns for follow up.  He is seen with the assistance of an interpreter via telephone. He denies chest pain, significant dyspnea, orthopnea, PND or edema. He denies syncope.  Studies:  - LHC (12/27/13): LAD 99%, EF 50%. PCI: 3.5 x 16 mm Promus premier DES to the LAD    LFTs increased on lipitor.  Better LFTs on Pravastatin.  LDL 79 on 9/28.   Wt Readings from Last 3 Encounters:  07/11/14 153 lb 9.6 oz (69.673 kg)  05/01/14 154 lb (69.854 kg)  03/23/14 157 lb (71.215 kg)     Past Medical History  Diagnosis Date  . Hypertension   . Gout   . Intermediate coronary syndrome   . Renal cyst, left   . Elevated LFTs   . HLD (hyperlipidemia)   . Colon polyp     Current Outpatient Prescriptions  Medication Sig Dispense Refill  . aspirin EC 81 MG EC tablet Take 1 tablet (81 mg total) by mouth daily.      . B Complex-C (B-COMPLEX WITH VITAMIN C) tablet Take 1 tablet by mouth daily.      . metoprolol tartrate (LOPRESSOR) 25 MG tablet Take 0.5 tablets (12.5 mg total) by mouth 2 (two) times daily.  60 tablet  5  . nitroGLYCERIN (NITROSTAT) 0.4 MG SL tablet Place 1 tablet (0.4 mg total) under the tongue every 5 (five) minutes as needed for chest pain.  25 tablet  12  . pravastatin (PRAVACHOL) 40 MG tablet Take 1 tablet (40 mg total) by mouth every evening.  90 tablet  3  . Ticagrelor (BRILINTA) 90 MG TABS tablet Take 1 tablet (90 mg total) by mouth 2 (two) times daily.  180 tablet  2   No current  facility-administered medications for this visit.    Allergies:    Allergies  Allergen Reactions  . Lipitor [Atorvastatin] Other (See Comments)    Elevated LFTs    Social History:  The patient  reports that he quit smoking about 29 years ago. His smoking use included Cigarettes. He smoked 0.00 packs per day. He has never used smokeless tobacco. He reports that he does not drink alcohol or use illicit drugs.   Family History:  The patient's family history includes Emphysema in his brother; Heart disease in his mother; Lung cancer in his father.   ROS:  Please see the history of present illness.  No nausea, vomiting.  No fevers, chills.  No focal weakness.  No dysuria.    All other systems reviewed and negative.   PHYSICAL EXAM: VS:  BP 130/78  Pulse 70  Ht 5\' 7"  (1.702 m)  Wt 153 lb 9.6 oz (69.673 kg)  BMI 24.05 kg/m2 Well nourished, well developed, in no acute distress HEENT: normal Neck: no JVD, no carotid bruits Cardiac:  normal S1, S2; RRR;  Lungs:  clear to auscultation bilaterally, no wheezing, rhonchi or rales Abd: soft, nontender, no hepatomegaly  Ext: no edema Skin: warm and dry Neuro:   no focal abnormalities noted  EKG:      ASSESSMENT AND PLAN:  1. CAD: Brilinta is expensive for him.  No prior MI.  Will change to Plavix 75 mg daily.  We'll have him take one 50 mg daily for the first 2 days to have Plavix, fully providing antiplatelet therapy 2. Hypotension: Occasional: Holding metoprolol on those days. Stay well hydrated.  No prior MI. If blood pressure stays low, could stop metoprolol. 3.  Hyperlipidemia: Stay on Pravastatin.  LFTs much improved on pravastatin.  Signed, Fredric MareJay S. Varanasi, MD, [facility] 07/11/2014 4:07 PM

## 2014-09-14 ENCOUNTER — Encounter (HOSPITAL_COMMUNITY): Payer: Self-pay | Admitting: Cardiovascular Disease

## 2014-10-20 IMAGING — CR DG CHEST 2V
2 series · 2 of 2 positions shown · non-contrast
Comparison: None.

CLINICAL DATA: Chest pain

EXAM:
CHEST  2 VIEW

[w chest pa]
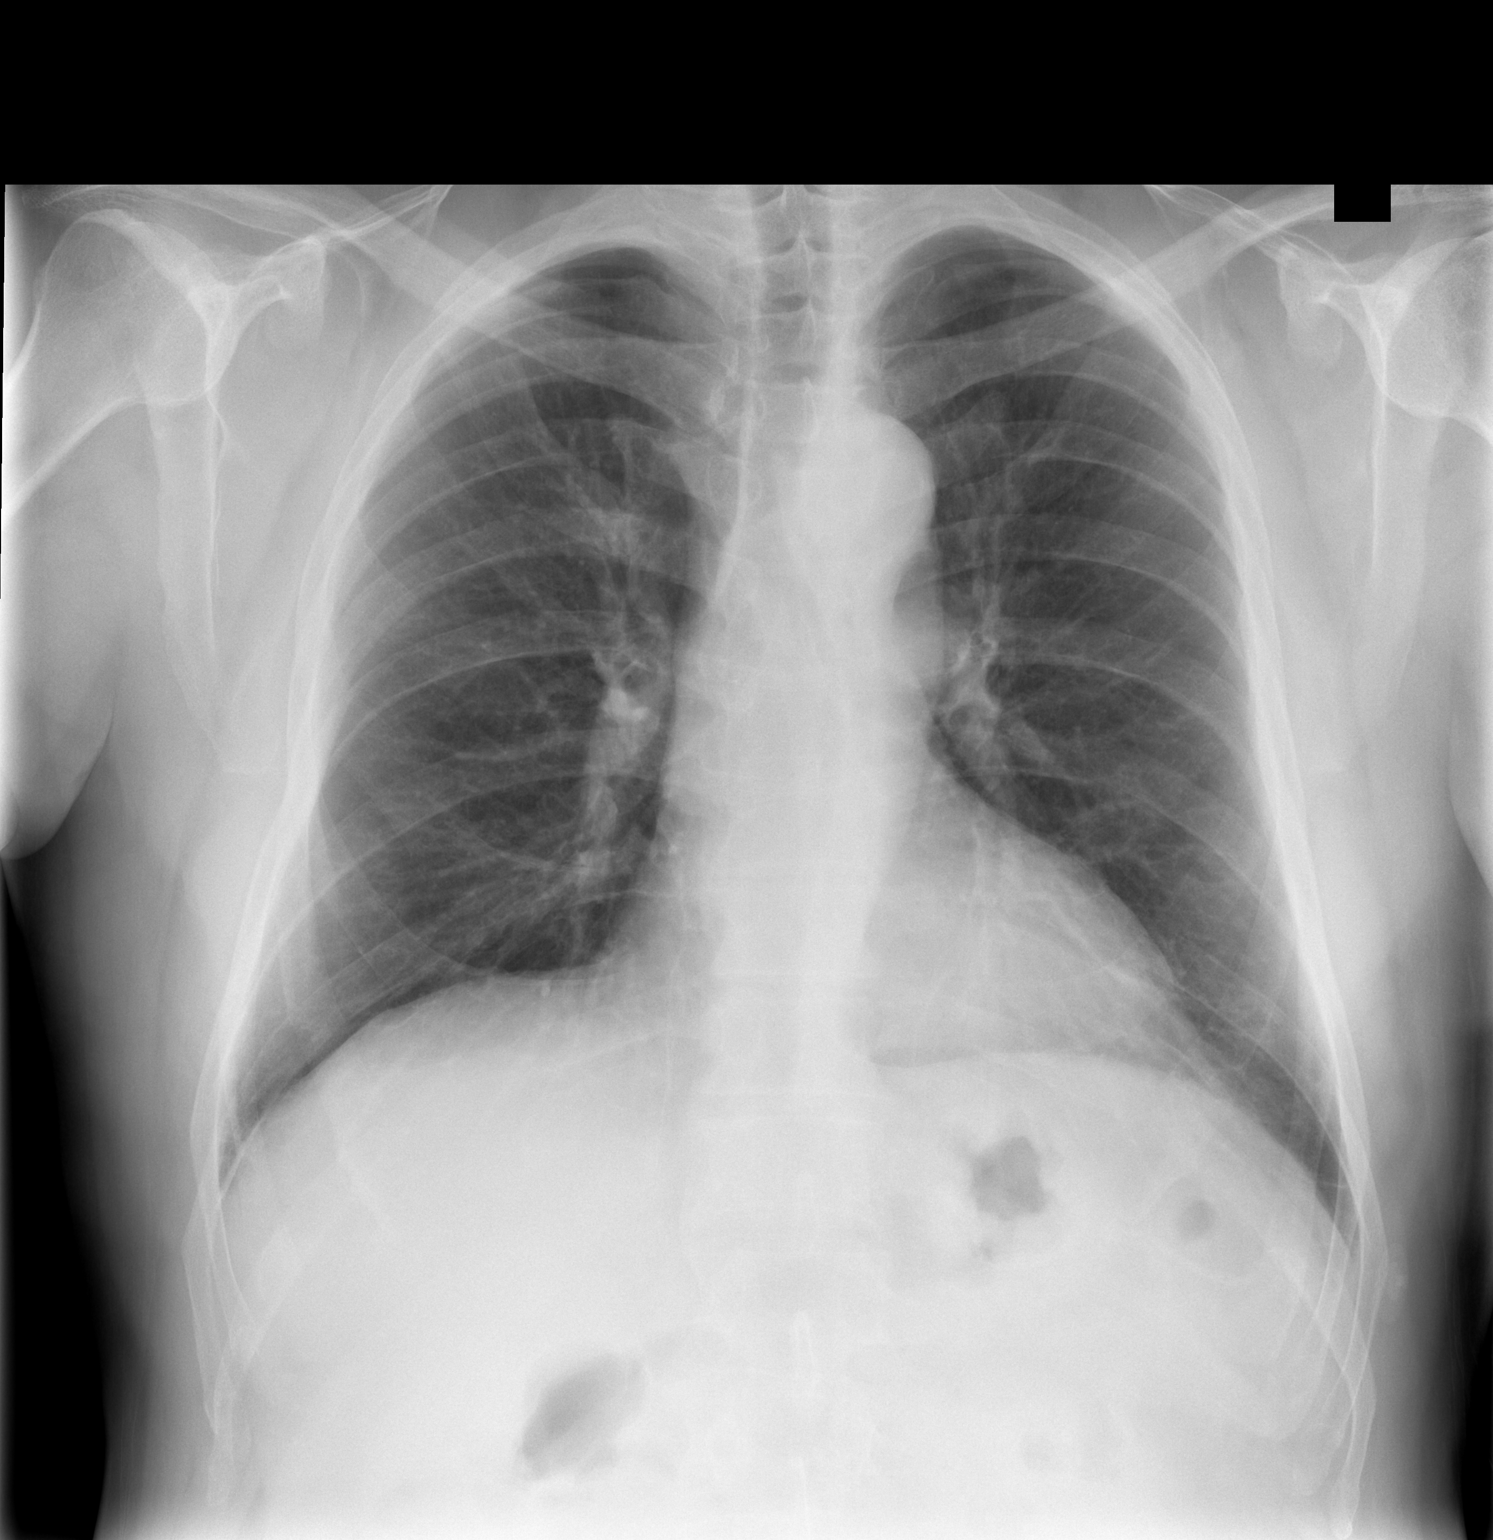

[w chest lat]
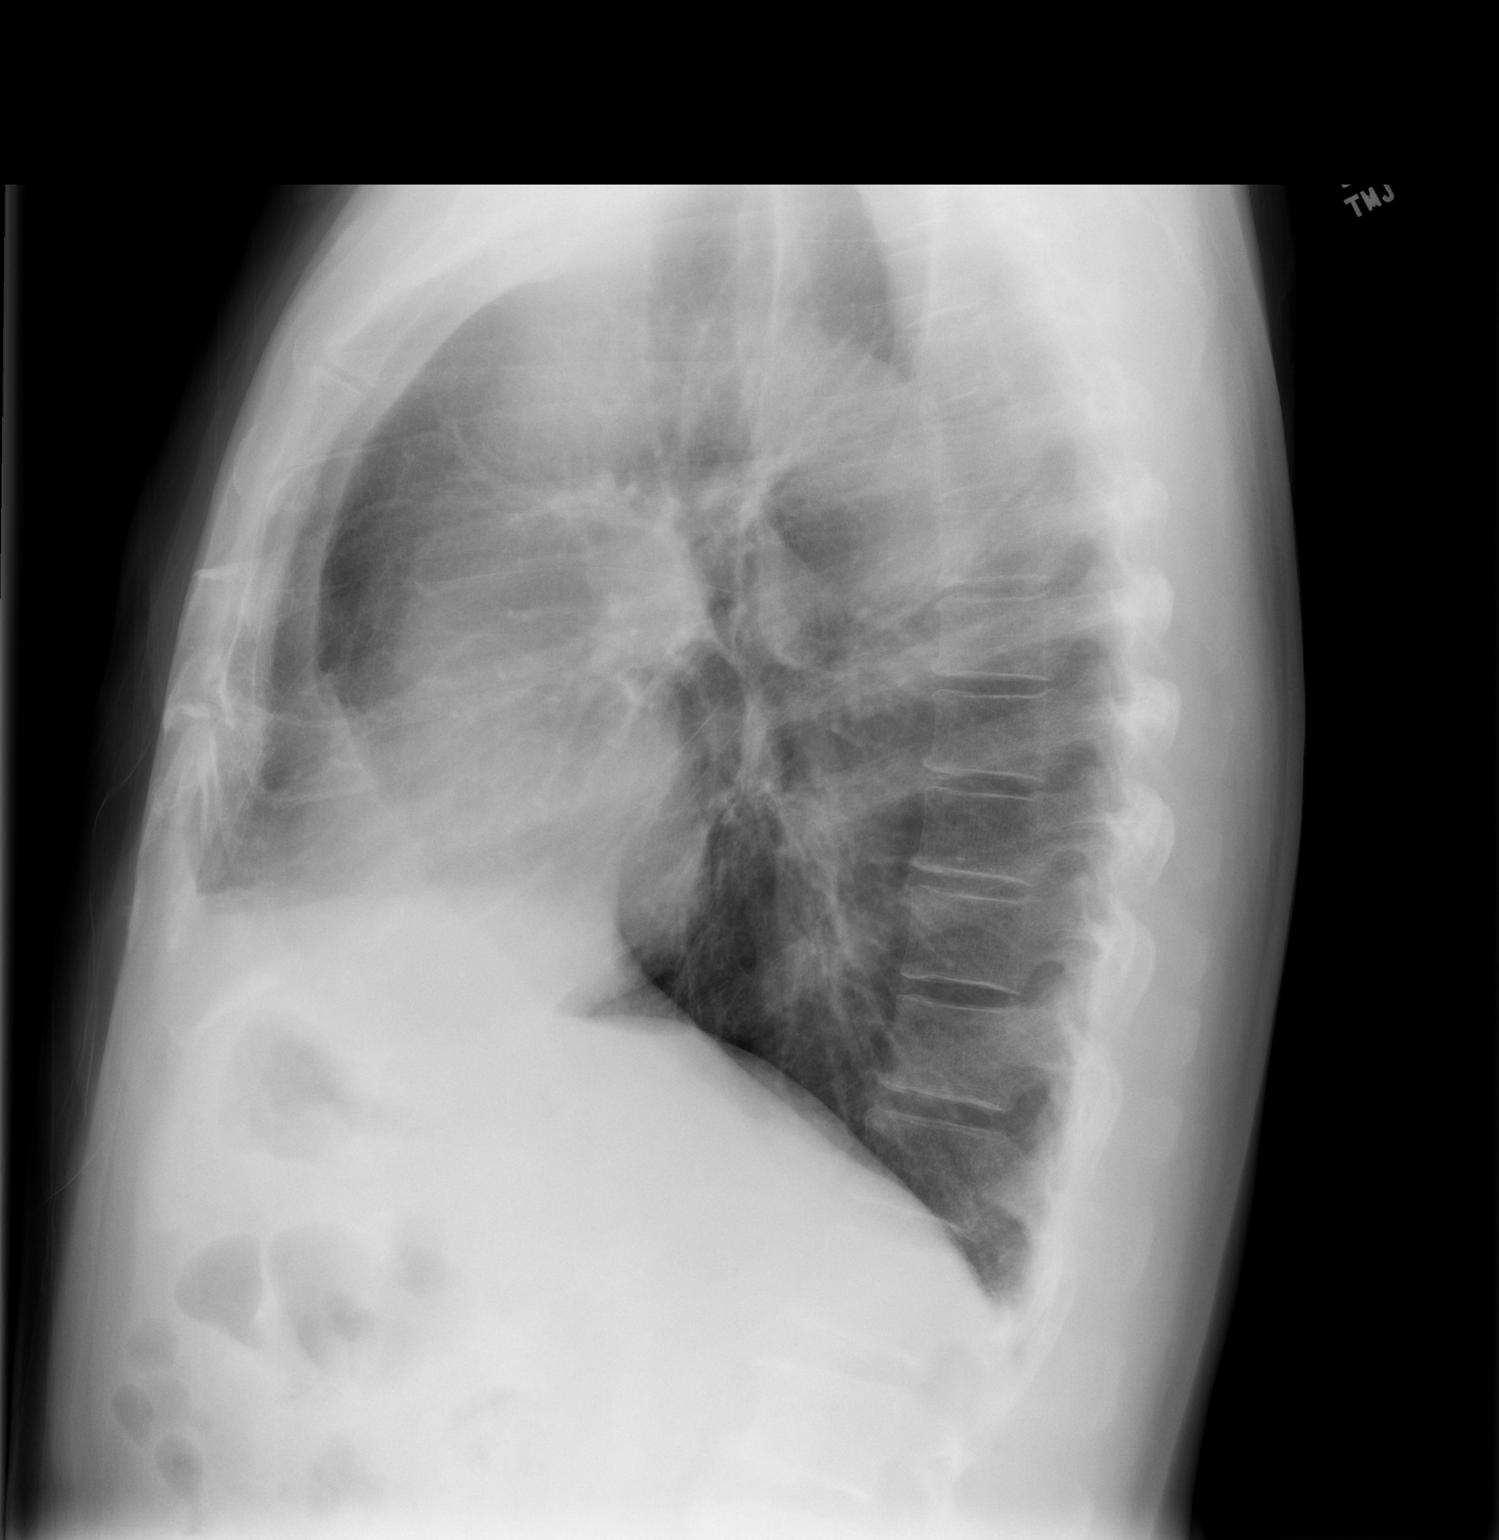

[2 of 2 positions shown; findings below may reference images not displayed]

FINDINGS: The heart size and mediastinal contours are within normal limits.
Both lungs are clear. The visualized skeletal structures are
unremarkable.
IMPRESSION: No active cardiopulmonary disease.

## 2014-12-05 ENCOUNTER — Telehealth: Payer: Self-pay | Admitting: Interventional Cardiology

## 2014-12-05 DIAGNOSIS — E785 Hyperlipidemia, unspecified: Secondary | ICD-10-CM

## 2014-12-05 DIAGNOSIS — I10 Essential (primary) hypertension: Secondary | ICD-10-CM

## 2014-12-05 NOTE — Telephone Encounter (Signed)
New Msg          Pt wife calling states pt wants to have some labs completed, to check blood work.    Please return call.

## 2014-12-05 NOTE — Telephone Encounter (Signed)
Will forward to Dr. Eldridge DaceVaranasi for recommendations on labs. The patient is scheduled to follow up on 01/10/15 with Dr. Eldridge DaceVaranasi.

## 2014-12-06 NOTE — Telephone Encounter (Signed)
DPR on file, ok to talk to wife. Spoke with wife in regards to orders for lab work. Wife states that pt is not having any symptoms, he just wanted to have labs drawn prior to appt because last time he had an appt they drew labs and changed his meds so he wanted them drawn prior to appt this time. Made appt for labs on 12/17/13. Wife verbalized understanding and was in agreement with this plan.

## 2014-12-06 NOTE — Telephone Encounter (Signed)
Can check CMet and lipids for HTN, hyperlipidemia.  Is he having any symptoms?

## 2014-12-18 ENCOUNTER — Other Ambulatory Visit (INDEPENDENT_AMBULATORY_CARE_PROVIDER_SITE_OTHER): Payer: BLUE CROSS/BLUE SHIELD | Admitting: *Deleted

## 2014-12-18 DIAGNOSIS — I10 Essential (primary) hypertension: Secondary | ICD-10-CM

## 2014-12-18 DIAGNOSIS — E785 Hyperlipidemia, unspecified: Secondary | ICD-10-CM

## 2014-12-18 LAB — COMPREHENSIVE METABOLIC PANEL
ALT: 15 U/L (ref 0–53)
AST: 19 U/L (ref 0–37)
Albumin: 4.3 g/dL (ref 3.5–5.2)
Alkaline Phosphatase: 72 U/L (ref 39–117)
BILIRUBIN TOTAL: 0.5 mg/dL (ref 0.2–1.2)
BUN: 15 mg/dL (ref 6–23)
CALCIUM: 9.5 mg/dL (ref 8.4–10.5)
CHLORIDE: 105 meq/L (ref 96–112)
CO2: 32 meq/L (ref 19–32)
Creatinine, Ser: 1 mg/dL (ref 0.40–1.50)
GFR: 80.53 mL/min (ref >60.00)
GLUCOSE: 103 mg/dL — AB (ref 70–99)
Potassium: 4.7 mEq/L (ref 3.5–5.1)
Sodium: 140 mEq/L (ref 135–145)
Total Protein: 6.8 g/dL (ref 6.0–8.3)

## 2014-12-18 LAB — LIPID PANEL
CHOL/HDL RATIO: 3
Cholesterol: 142 mg/dL (ref 0–200)
HDL: 41.1 mg/dL (ref >39.00)
LDL CALC: 77 mg/dL (ref 0–99)
NonHDL: 100.9
Triglycerides: 121 mg/dL (ref 0.0–149.0)
VLDL: 24.2 mg/dL (ref 0.0–40.0)

## 2014-12-18 NOTE — Addendum Note (Signed)
Addended by: Tonita PhoenixBOWDEN, ROBIN K on: 12/18/2014 07:40 AM   Modules accepted: Orders

## 2014-12-25 ENCOUNTER — Telehealth: Payer: Self-pay | Admitting: Interventional Cardiology

## 2014-12-25 NOTE — Telephone Encounter (Signed)
New message ° ° ° ° ° °Returning a nurses call from last week °

## 2014-12-25 NOTE — Telephone Encounter (Signed)
Returned pt's wife's call- DPR on file. Informed wife of lab results and to continue current medications. Wife verbalized understanding and was in agreement with this plan.

## 2015-01-10 ENCOUNTER — Ambulatory Visit (INDEPENDENT_AMBULATORY_CARE_PROVIDER_SITE_OTHER): Payer: BLUE CROSS/BLUE SHIELD | Admitting: Interventional Cardiology

## 2015-01-10 ENCOUNTER — Encounter: Payer: Self-pay | Admitting: Interventional Cardiology

## 2015-01-10 VITALS — BP 126/74 | HR 66 | Ht 67.0 in | Wt 154.1 lb

## 2015-01-10 DIAGNOSIS — I2510 Atherosclerotic heart disease of native coronary artery without angina pectoris: Secondary | ICD-10-CM | POA: Diagnosis not present

## 2015-01-10 DIAGNOSIS — R945 Abnormal results of liver function studies: Secondary | ICD-10-CM

## 2015-01-10 DIAGNOSIS — E785 Hyperlipidemia, unspecified: Secondary | ICD-10-CM

## 2015-01-10 DIAGNOSIS — I200 Unstable angina: Secondary | ICD-10-CM

## 2015-01-10 DIAGNOSIS — R7989 Other specified abnormal findings of blood chemistry: Secondary | ICD-10-CM | POA: Diagnosis not present

## 2015-01-10 NOTE — Progress Notes (Signed)
Patient ID: George Burns, male   DOB: [birth year], 64 y.o.   MRN: [id number]     Cardiology Office Note   Date:  01/10/2015   ID:  George Burns, DOB 1951/05/11, MRN [id number]  PCP:  Pcp Not In System    Chief Complaint  Patient presents with  . Follow-up    6 month     Wt Readings from Last 3 Encounters:  01/10/15 154 lb 1.9 oz (69.908 kg)  07/11/14 153 lb 9.6 oz (69.673 kg)  05/01/14 154 lb (69.854 kg)       History of Present Illness: George Burns is a 64 y.o. male  with a hx of CAD s/p DES to LAD in 12/2013, HTN, HL. Recent labs demonstrate significantly increased LFTs. Lipitor has been stopped. He returns for follow up.  He remains very active at work.  He denies chest pain, significant dyspnea, orthopnea, PND or edema. He denies syncope.  Studies:  - LHC (12/27/13): LAD 99%, EF 50%. PCI: 3.5 x 16 mm Promus premier DES to the LAD    LFTs increased on lipitor. Better LFTs on Pravastatin. LDL 79 on 9/28.    Past Medical History  Diagnosis Date  . Hypertension   . Gout   . Intermediate coronary syndrome   . Renal cyst, left   . Elevated LFTs   . HLD (hyperlipidemia)   . Colon polyp     Past Surgical History  Procedure Laterality Date  . Coronary stent placement    . Colonoscopy    . Left heart catheterization with coronary angiogram N/A 12/27/2013    Procedure: LEFT HEART CATHETERIZATION WITH CORONARY ANGIOGRAM;  Surgeon: Lennette Bihari, MD;  Location: [facility] CATH LAB;  Service: Cardiovascular;  Laterality: N/A;     Current Outpatient Prescriptions  Medication Sig Dispense Refill  . clopidogrel (PLAVIX) 75 MG tablet Take 1 tablet (75 mg total) by mouth daily. 90 tablet 3  . metoprolol tartrate (LOPRESSOR) 25 MG tablet Take 0.5 tablets (12.5 mg total) by mouth 2 (two) times daily. (Patient taking differently: Take 12.5 mg by mouth 2 (two) times daily as needed. Patient wife stated take only if SBP > 130) 60 tablet 5  . nitroGLYCERIN  (NITROSTAT) 0.4 MG SL tablet Place 1 tablet (0.4 mg total) under the tongue every 5 (five) minutes as needed for chest pain. 25 tablet 12  . pravastatin (PRAVACHOL) 40 MG tablet Take 1 tablet (40 mg total) by mouth every evening. 90 tablet 3   No current facility-administered medications for this visit.    Allergies:   Lipitor    Social History:  The patient  reports that he quit smoking about 30 years ago. His smoking use included Cigarettes. He has never used smokeless tobacco. He reports that he does not drink alcohol or use illicit drugs.   Family History:  The patient's family history includes Emphysema in his brother; Heart disease in his mother; Lung cancer in his father.    ROS:  Please see the history of present illness.   Otherwise, review of systems are positive for nuisance bleeding.   All other systems are reviewed and negative.    PHYSICAL EXAM: VS:  BP 126/74 mmHg  Pulse 66  Ht  (1.702 m)  Wt 154 lb 1.9 oz (69.908 kg)  BMI 24.13 kg/m2 , BMI Body mass index is 24.13 kg/(m^2). GEN: Well nourished, well developed, in no acute distress HEENT: normal Neck: no JVD, carotid bruits, or masses Cardiac:  RRR; no murmurs, rubs, or gallops,no edema  Respiratory:  clear to auscultation bilaterally, normal work of breathing GI: soft, nontender, nondistended, + BS MS: no deformity or atrophy Skin: warm and dry, no rash Neuro:  Strength and sensation are intact Psych: euthymic mood, full affect   EKG:   The ekg ordered today demonstrates normal sinus rhythm. I compared this to the previous ECG and there is been no significant change.   Recent Labs: 03/23/2014: Hemoglobin 14.6; Platelets 178.0 12/18/2014: ALT 15; BUN 15; Creatinine 1.00; Potassium 4.7; Sodium 140   Lipid Panel    Component Value Date/Time   CHOL 142 12/18/2014 0740   TRIG 121.0 12/18/2014 0740   HDL 41.10 12/18/2014 0740   CHOLHDL 3 12/18/2014 0740   VLDL 24.2 12/18/2014 0740   LDLCALC 77 12/18/2014  0740     Other studies Reviewed: Additional studies/ records that were reviewed today with results demonstrating: LAD stent as documented above. Prior ECG reviewed as noted above as well..   ASSESSMENT AND PLAN:  1. CAD: continue Plavix 75 mg daily. will stop aspirin.  No anginal symptoms. He will let us know if this occurs. 2. Hypotension: improved. Toleratingmetoprolol. 3. Hyperlipidemia: Stay on Pravastatin. LFTs much improved on pravastatin.  Lipids from March 2016 reviewed and well controlled. LFTs also well controlled.   Current medicines are reviewed at length with the patient today.  The patient concerns regarding his medicines were addressed.  The following changes have been made:  No change:stop aspirin  Labs/ tests ordered today include:   Orders Placed This Encounter  Procedures  . Lipid Profile  . Hepatic function panel  . EKG 12-Lead    Recommend 150 minutes/week of aerobic exercise Low fat, low carb, high fiber diet recommended  Disposition:   FU in 1 year   Delorise JacksonSigned, Luccas Towell S., MD  01/10/2015 5:16 PM    [facility] [address], [place], [place]  [address] Phone: [phone]; Fax: [phone]

## 2015-01-10 NOTE — Patient Instructions (Signed)
Your physician has recommended you make the following change in your medication:  1) STOP Aspirin   Your physician recommends that you return for lab work in: 6 months ( FASTING Lipids/Liver)  Your physician wants you to follow-up in: 1 YEAR WITH DR. VARANASI. You will receive a reminder letter in the mail two months in advance. If you don't receive a letter, please call our office to schedule the follow-up appointment.

## 2015-02-13 ENCOUNTER — Encounter: Payer: Self-pay | Admitting: Internal Medicine

## 2015-05-15 ENCOUNTER — Encounter: Payer: Self-pay | Admitting: Internal Medicine

## 2015-05-21 ENCOUNTER — Other Ambulatory Visit: Payer: Self-pay | Admitting: *Deleted

## 2015-05-21 DIAGNOSIS — I2510 Atherosclerotic heart disease of native coronary artery without angina pectoris: Secondary | ICD-10-CM

## 2015-05-21 DIAGNOSIS — R7989 Other specified abnormal findings of blood chemistry: Secondary | ICD-10-CM

## 2015-05-21 DIAGNOSIS — R945 Abnormal results of liver function studies: Secondary | ICD-10-CM

## 2015-05-21 DIAGNOSIS — E785 Hyperlipidemia, unspecified: Secondary | ICD-10-CM

## 2015-05-21 MED ORDER — PRAVASTATIN SODIUM 40 MG PO TABS: 40.0000 mg | ORAL_TABLET | Freq: Every evening | ORAL | Status: DC

## 2015-07-12 ENCOUNTER — Telehealth: Payer: Self-pay | Admitting: Interventional Cardiology

## 2015-07-12 ENCOUNTER — Other Ambulatory Visit (INDEPENDENT_AMBULATORY_CARE_PROVIDER_SITE_OTHER): Payer: 59 | Admitting: *Deleted

## 2015-07-12 DIAGNOSIS — I2510 Atherosclerotic heart disease of native coronary artery without angina pectoris: Secondary | ICD-10-CM

## 2015-07-12 LAB — HEPATIC FUNCTION PANEL
ALT: 22 U/L (ref 9–46)
AST: 23 U/L (ref 10–35)
Albumin: 4.3 g/dL (ref 3.6–5.1)
Alkaline Phosphatase: 57 U/L (ref 40–115)
Bilirubin, Direct: 0.2 mg/dL
Indirect Bilirubin: 0.5 mg/dL (ref 0.2–1.2)
Total Bilirubin: 0.7 mg/dL (ref 0.2–1.2)
Total Protein: 6.8 g/dL (ref 6.1–8.1)

## 2015-07-12 LAB — LIPID PANEL
CHOL/HDL RATIO: 3.4 ratio (ref <=5.0)
CHOLESTEROL: 156 mg/dL (ref 125–200)
HDL: 46 mg/dL (ref >=40)
LDL CALC: 87 mg/dL (ref <130)
Triglycerides: 115 mg/dL (ref <150)
VLDL: 23 mg/dL (ref <30)

## 2015-07-12 NOTE — Telephone Encounter (Signed)
Walk in pt form-pt needs refills-[facility] [address]

## 2015-07-13 ENCOUNTER — Other Ambulatory Visit: Payer: Self-pay

## 2015-07-13 MED ORDER — CLOPIDOGREL BISULFATE 75 MG PO TABS: 75.0000 mg | ORAL_TABLET | Freq: Every day | ORAL | Status: DC

## 2015-08-10 ENCOUNTER — Other Ambulatory Visit: Payer: Self-pay | Admitting: Interventional Cardiology

## 2015-08-10 MED ORDER — CLOPIDOGREL BISULFATE 75 MG PO TABS: 75.0000 mg | ORAL_TABLET | Freq: Every day | ORAL | Status: DC

## 2015-11-22 ENCOUNTER — Telehealth: Payer: Self-pay | Admitting: Interventional Cardiology

## 2015-11-22 MED ORDER — METOPROLOL TARTRATE 25 MG PO TABS: 12.5000 mg | ORAL_TABLET | Freq: Two times a day (BID) | ORAL | Status: AC

## 2015-11-22 NOTE — Telephone Encounter (Signed)
Pt's Rx sent to pt's pharmacy as requested. Confirmation received.  °

## 2015-11-22 NOTE — Telephone Encounter (Signed)
New Message   *STAT* If patient is at the pharmacy, call can be transferred to refill team.   1. Which medications need to be refilled? (please list name of each medication and dose if known) metoprolol tartrate (LOPRESSOR) 25 MG tablet   2. Which pharmacy/location (including street and city if local pharmacy) is medication to be sent to? [facility] [address]   3. Do they need a 30 day or 90 day supply? 90

## 2016-01-01 ENCOUNTER — Telehealth: Payer: Self-pay

## 2016-01-01 NOTE — Telephone Encounter (Signed)
**Note De-Identified George Burns Obfuscation** The pt needs to have a cervical spine epidural injection with [facility]. They are requesting that he stop taking Plavix 7 days prior to procedure.  Please advise.

## 2016-01-02 NOTE — Telephone Encounter (Signed)
OK to hold Plavix 7 days prior to the injection.

## 2016-01-03 NOTE — Telephone Encounter (Signed)
Clearance placed in nurse fax box 

## 2016-01-09 ENCOUNTER — Telehealth: Payer: Self-pay | Admitting: Interventional Cardiology

## 2016-01-09 NOTE — Telephone Encounter (Signed)
New message      Request for surgical clearance:  What type of surgery is being performed? epidural 1. When is this surgery scheduled?  Pending clearance  2. Are there any medications that need to be held prior to surgery and how long? Hold plavix 7 days prior and cardiac clearance  Name of physician performing surgery?  Dr Claria DiceHao Wang 3. What is your office phone and fax number?  Fax [phone]

## 2016-01-09 NOTE — Telephone Encounter (Signed)
OK to hold Plavix 7 days prior to epidural.

## 2016-01-09 NOTE — Telephone Encounter (Signed)
**Note De-identified Sukari Grist Obfuscation** Please advise 

## 2016-01-10 NOTE — Telephone Encounter (Signed)
This message has been faxed to Dr Marily MemosHao Wang's office @ [phone]. I did receive confirmation that the fax went through successfully.

## 2016-01-15 ENCOUNTER — Other Ambulatory Visit: Payer: Self-pay | Admitting: Interventional Cardiology

## 2016-01-30 ENCOUNTER — Ambulatory Visit: Payer: 59 | Admitting: Interventional Cardiology

## 2016-02-27 ENCOUNTER — Telehealth: Payer: Self-pay

## 2016-02-27 ENCOUNTER — Other Ambulatory Visit: Payer: Self-pay | Admitting: Orthopedic Surgery

## 2016-02-27 NOTE — Telephone Encounter (Addendum)
The pt is scheduled to have ACDF C7-T1 on 03/12/16 [facility] They are requesting Plavix protocol.  The pt is overdue for f/u and has not been seen since 01/20/15.  Please advise.

## 2016-02-27 NOTE — Telephone Encounter (Signed)
This message has been faxed to George Burns at [phone]. I did receive confirmation that the fax went through successfully.

## 2016-02-27 NOTE — Telephone Encounter (Signed)
OK to hold Plavix 5 days prior to the procedure.

## 2016-03-06 ENCOUNTER — Encounter (HOSPITAL_COMMUNITY)
Admission: RE | Admit: 2016-03-06 | Discharge: 2016-03-06 | Disposition: A | Discharge status: Home / Self Care | Payer: Worker's Compensation | Source: Ambulatory Visit | Attending: Orthopedic Surgery | Admitting: Orthopedic Surgery

## 2016-03-06 ENCOUNTER — Encounter (HOSPITAL_COMMUNITY): Payer: Self-pay

## 2016-03-06 DIAGNOSIS — Z01818 Encounter for other preprocedural examination: Secondary | ICD-10-CM | POA: Diagnosis present

## 2016-03-06 DIAGNOSIS — Z01812 Encounter for preprocedural laboratory examination: Secondary | ICD-10-CM | POA: Diagnosis not present

## 2016-03-06 DIAGNOSIS — I10 Essential (primary) hypertension: Secondary | ICD-10-CM | POA: Diagnosis not present

## 2016-03-06 DIAGNOSIS — Z87891 Personal history of nicotine dependence: Secondary | ICD-10-CM | POA: Insufficient documentation

## 2016-03-06 DIAGNOSIS — Z7902 Long term (current) use of antithrombotics/antiplatelets: Secondary | ICD-10-CM | POA: Insufficient documentation

## 2016-03-06 DIAGNOSIS — M79601 Pain in right arm: Secondary | ICD-10-CM | POA: Diagnosis not present

## 2016-03-06 DIAGNOSIS — R9431 Abnormal electrocardiogram [ECG] [EKG]: Secondary | ICD-10-CM | POA: Insufficient documentation

## 2016-03-06 DIAGNOSIS — I2510 Atherosclerotic heart disease of native coronary artery without angina pectoris: Secondary | ICD-10-CM | POA: Insufficient documentation

## 2016-03-06 DIAGNOSIS — Z79899 Other long term (current) drug therapy: Secondary | ICD-10-CM | POA: Diagnosis not present

## 2016-03-06 DIAGNOSIS — Z955 Presence of coronary angioplasty implant and graft: Secondary | ICD-10-CM | POA: Diagnosis not present

## 2016-03-06 DIAGNOSIS — E785 Hyperlipidemia, unspecified: Secondary | ICD-10-CM | POA: Insufficient documentation

## 2016-03-06 LAB — URINALYSIS, ROUTINE W REFLEX MICROSCOPIC
BILIRUBIN URINE: NEGATIVE
Glucose, UA: NEGATIVE mg/dL
Hgb urine dipstick: NEGATIVE
KETONES UR: NEGATIVE mg/dL
LEUKOCYTES UA: NEGATIVE
NITRITE: NEGATIVE
PH: 7 (ref 5.0–8.0)
Protein, ur: NEGATIVE mg/dL
SPECIFIC GRAVITY, URINE: 1.011 (ref 1.005–1.030)

## 2016-03-06 LAB — CBC WITH DIFFERENTIAL/PLATELET
BASOS ABS: 0 10*3/uL (ref 0.0–0.1)
BASOS PCT: 0 %
EOS ABS: 0.1 10*3/uL (ref 0.0–0.7)
EOS PCT: 2 %
HCT: 45 % (ref 39.0–52.0)
Hemoglobin: 14.3 g/dL (ref 13.0–17.0)
Lymphocytes Relative: 26 %
Lymphs Abs: 1.9 10*3/uL (ref 0.7–4.0)
MCH: 29.9 pg (ref 26.0–34.0)
MCHC: 31.8 g/dL (ref 30.0–36.0)
MCV: 94.1 fL (ref 78.0–100.0)
MONO ABS: 0.7 10*3/uL (ref 0.1–1.0)
Monocytes Relative: 9 %
NEUTROS ABS: 4.6 10*3/uL (ref 1.7–7.7)
Neutrophils Relative %: 63 %
PLATELETS: 166 10*3/uL (ref 150–400)
RBC: 4.78 MIL/uL (ref 4.22–5.81)
RDW: 13.5 % (ref 11.5–15.5)
WBC: 7.3 10*3/uL (ref 4.0–10.5)

## 2016-03-06 LAB — COMPREHENSIVE METABOLIC PANEL
ALBUMIN: 4.3 g/dL (ref 3.5–5.0)
ALT: 24 U/L (ref 17–63)
ANION GAP: 8 (ref 5–15)
AST: 27 U/L (ref 15–41)
Alkaline Phosphatase: 53 U/L (ref 38–126)
BUN: 14 mg/dL (ref 6–20)
CHLORIDE: 105 mmol/L (ref 101–111)
CO2: 28 mmol/L (ref 22–32)
Calcium: 9.6 mg/dL (ref 8.9–10.3)
Creatinine, Ser: 1.08 mg/dL (ref 0.61–1.24)
GFR calc Af Amer: >60 mL/min (ref >60)
GFR calc non Af Amer: >60 mL/min (ref >60)
GLUCOSE: 95 mg/dL (ref 65–99)
POTASSIUM: 5.2 mmol/L — AB (ref 3.5–5.1)
Sodium: 141 mmol/L (ref 135–145)
Total Bilirubin: 0.8 mg/dL (ref 0.3–1.2)
Total Protein: 7 g/dL (ref 6.5–8.1)

## 2016-03-06 LAB — PROTIME-INR
INR: 0.99 (ref 0.00–1.49)
PROTHROMBIN TIME: 13.3 s (ref 11.6–15.2)

## 2016-03-06 LAB — SURGICAL PCR SCREEN
MRSA, PCR: NEGATIVE
STAPHYLOCOCCUS AUREUS: NEGATIVE

## 2016-03-06 LAB — APTT: APTT: 26 s (ref 24–37)

## 2016-03-06 NOTE — Pre-Procedure Instructions (Signed)
Retta MacStanislaw J [facility]  03/06/2016      [facility] - [place], [place] - [address] [place] [address] Phone: [phone] Fax: [phone]  [facility] [place] - [id number] [place] [address] [place] [address] Phone: [phone] Fax: [phone]    Your procedure is scheduled on June 7  [facility] 630 [facility].M.  Call this number if you have problems the morning of surgery:  [phone]   Remember:  Do not eat food or drink liquids after midnight.  Take these medicines the morning of surgery with A SIP OF WATER --metroplolo   Stop taking aspirin, Ibuprofen, Advil, motrin, BC's, Goody's, Herbal medications, Fish Oil, Aleve   Do not wear jewelry, make-up or nail polish.  Do not wear lotions, powders, or perfumes.  You may wear deodorant.  Do not shave 48 hours prior to surgery.  Men may shave face and neck.  Do not bring valuables to the hospital.  [facility] is not responsible for any belongings or valuables.  Contacts, dentures or bridgework may not be worn into surgery.  Leave your suitcase in the car.  After surgery it may be brought to your room.  For patients admitted to the hospital, discharge time will be determined by your treatment team.  Patients discharged the day of surgery will not be allowed to drive home.   Special instructions:  [place] - Preparing for Surgery  Before surgery, you can play an important role.  Because skin is not sterile, your skin needs to be as free of germs as possible.  You can reduce the number of germs on you skin by washing with CHG (chlorahexidine gluconate) soap before surgery.  CHG is an antiseptic cleaner which kills germs and bonds with the skin to continue killing germs even after washing.  Please DO NOT use if you have an allergy to CHG or antibacterial soaps.  If your skin becomes reddened/irritated  stop using the CHG and inform your nurse when you arrive at Short Stay.  Do not shave (including legs and underarms) for at least 48 hours prior to the first CHG shower.  You may shave your face.  Please follow these instructions carefully:   1.  Shower with CHG Soap the night before surgery and the  morning of Surgery.  2.  If you choose to wash your hair, wash your hair first as usual with your  normal shampoo.  3.  After you shampoo, rinse your hair and body thoroughly to remove the   Shampoo.  4.  Use CHG as you would any other liquid soap.  You can apply chg directly  to the skin and wash gently with scrungie or a clean washcloth.  5.  Apply the CHG Soap to your body ONLY FROM THE NECK DOWN.    Do not use on open wounds or open sores.  Avoid contact with your eyes,  ears, mouth and genitals (private parts).  Wash genitals (private parts) with your normal soap.  6.  Wash thoroughly, paying special attention to the area where your surgery  will be performed.  7.  Thoroughly rinse your body with warm water from the neck down.  8.  DO NOT shower/wash with your normal soap after using and rinsing off  the CHG Soap.  9.  Pat yourself dry with a clean towel.  10.  Wear clean pajamas.            11.  Place clean sheets on your bed the night of your first shower and do not sleep with pets.  Day of Surgery  Do not apply any lotions/deoderants the morning of surgery.  Please wear clean clothes to the hospital/surgery center.     Please read over the following fact sheets that you were given. Pain Booklet, Coughing and Deep Breathing, MRSA Information and Surgical Site Infection Prevention, Incentive Spirometry

## 2016-03-06 NOTE — Progress Notes (Signed)
Called Dr Deedra EhrichVaranaski for clearance,although pt hasn't seen him lately,pt stated he had clearance. Plaxix to stop X 5 days per DR Eldridge DaceVaranasi. Speaks some english, had translator with pat visit.

## 2016-03-07 NOTE — Progress Notes (Signed)
Anesthesia Chart Review:  Pt is a 65 year old male scheduled for C7-T1 ACDF on 03/12/2016 with Dr. Yevette Edwardsumonski.   Cardiologist is Dr. Lance MussJayadeep Varanasi, who is aware of upcoming surgery.   PMH includes:  CAD (DES to LAD 2015), HTN, hyperlipidemia. Former smoker. BMI 25  Medications include: plavix, metoprolol, pravastatin. Plavix to be stopped 5 days before surgery.   Preoperative labs reviewed.    Chest x-ray 03/06/16 reviewed. No active cardiopulmonary disease.   EKG 03/06/16: NSR. Left axis deviation. Inferior infarct, age undetermined. Appears stable when compared to 01/10/15 tracing.   Cardiac cath 12/26/13:  - Low normal to mild LV dysfunction with mild anterolateral hypocontractility and ejection fraction of 50% -99% stenosis in the proximal to mid LAD just beyond a moderate size septal perforating artery - Normal left circumflex and normal large dominant right coronary artery - Successful percutaneous coronary intervention to the LAD with the 99% stenosis being reduced to 0% with ultimate insertion of a 3.5x16 mm Promus Premier DES stent postdilated 3.71 mm  If no changes, I anticipate pt can proceed with surgery as scheduled.   Rica Mastngela Cindel Daugherty, FNP-BC [facility] Phone: [phone] 03/07/2016 11:20 AM

## 2016-03-10 NOTE — H&P (Signed)
PREOPERATIVE H&P  Chief Complaint: R arm pain  HPI: George Burns is a 65 y.o. male who presents with ongoing pain in the right arm x 4 months.   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 Had ESI on 01/10/16.  Patient has failed multiple forms of conservative care and continues to have pain (see office notes for additional details regarding the patient's full course of treatment)  Past Medical History  Diagnosis Date  . Hypertension   . Gout   . Intermediate coronary syndrome (HCC)   . Renal cyst, left   . Elevated LFTs   . HLD (hyperlipidemia)   . Colon polyp    Past Surgical History  Procedure Laterality Date  . Coronary stent placement    . Colonoscopy    . Left heart catheterization with coronary angiogram N/A 12/27/2013    Procedure: LEFT HEART CATHETERIZATION WITH CORONARY ANGIOGRAM;  Surgeon: Lennette Biharihomas A Kelly, MD;  [facility];  Service: Cardiovascular;  Laterality: N/A;  . Cardiac catheterization     Social History   Social History  . Marital Status: Married    Spouse Name: N/A  . Number of Children: 3  . Years of Education: N/A   Occupational History  . [employer]    Social History Main Topics  . Smoking status: Former Smoker    Types: Cigarettes    Quit date: 10/06/1984  . Smokeless tobacco: Never Used  . Alcohol Use: Yes     Comment: occ  . Drug Use: No  . Sexual Activity: Not on file   Other Topics Concern  . 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 One son to daughters. One cup of coffee a day area and no alcohol.   Family History  Problem Relation Age of Onset  . Heart disease Mother   . Lung cancer Father     smoker  . Emphysema Brother    Allergies  Allergen Reactions  . Lipitor [Atorvastatin] Other (See Comments)    Elevated LFTs   Prior to Admission medications   Medication Sig Start Date End Date Taking? Authorizing Provider    clopidogrel (PLAVIX) 75 MG tablet Take 1 tablet by mouth  daily 01/15/16  Yes Corky CraftsJayadeep S Varanasi, MD  co-enzyme Q-10 50 MG capsule Take 100 mg by mouth 2 (two) times daily.   Yes Historical Provider, MD  metoprolol tartrate (LOPRESSOR) 25 MG tablet Take 0.5 tablets (12.5 mg total) by mouth 2 (two) times daily. 11/22/15  Yes Corky CraftsJayadeep S Varanasi, MD  nitroGLYCERIN (NITROSTAT) 0.4 MG SL tablet Place 1 tablet (0.4 mg total) under the tongue every 5 (five) minutes as needed for chest pain. 12/28/13  Yes Dwana MelenaBryan W Hager, PA-C  pravastatin (PRAVACHOL) 40 MG tablet Take 1 tablet (40 mg total) by mouth every evening. 05/21/15  Yes Corky CraftsJayadeep S Varanasi, MD     All other systems have been reviewed and were otherwise negative with the exception of those mentioned in the HPI and as above.  Physical Exam: There were no vitals filed for this visit.  General: Alert, no acute distress Cardiovascular: No pedal edema Respiratory: No cyanosis, no use of accessory musculature Skin: No lesions in the area of chief complaint Neurologic: Sensation intact distally Psychiatric: Patient is competent for consent with normal mood and affect Lymphatic: No axillary or cervical lymphadenopathy  MUSCULOSKELETAL: 4/5 strength to hand intrinsics  Assessment/Plan: Right arm pain  Plan for Procedure(s): ANTERIOR CERVICAL DECOMPRESSION FUSION CERVICAL 7 -THORACIC 1 WITH INSTRUMENTATION AND ALLOGRAFT   Emilee Hero, MD 03/10/2016 8:14 AM

## 2016-03-12 ENCOUNTER — Encounter (HOSPITAL_COMMUNITY): Payer: Self-pay | Admitting: General Practice

## 2016-03-12 ENCOUNTER — Ambulatory Visit (HOSPITAL_COMMUNITY): Payer: Worker's Compensation

## 2016-03-12 ENCOUNTER — Ambulatory Visit (HOSPITAL_COMMUNITY): Payer: Worker's Compensation | Admitting: Certified Registered"

## 2016-03-12 ENCOUNTER — Observation Stay (HOSPITAL_COMMUNITY)
Admission: RE | Admit: 2016-03-12 | Discharge: 2016-03-13 | Disposition: A | Discharge status: Home / Self Care | Payer: Worker's Compensation | Source: Ambulatory Visit | Attending: Orthopedic Surgery | Admitting: Orthopedic Surgery

## 2016-03-12 ENCOUNTER — Encounter (HOSPITAL_COMMUNITY)
Admission: RE | Disposition: A | Discharge status: Home / Self Care | Payer: Self-pay | Source: Ambulatory Visit | Attending: Orthopedic Surgery

## 2016-03-12 ENCOUNTER — Ambulatory Visit (HOSPITAL_COMMUNITY): Payer: Worker's Compensation | Admitting: Emergency Medicine

## 2016-03-12 DIAGNOSIS — M5410 Radiculopathy, site unspecified: Secondary | ICD-10-CM | POA: Diagnosis present

## 2016-03-12 DIAGNOSIS — Z955 Presence of coronary angioplasty implant and graft: Secondary | ICD-10-CM | POA: Insufficient documentation

## 2016-03-12 DIAGNOSIS — M5013 Cervical disc disorder with radiculopathy, cervicothoracic region: Principal | ICD-10-CM | POA: Insufficient documentation

## 2016-03-12 DIAGNOSIS — Z7902 Long term (current) use of antithrombotics/antiplatelets: Secondary | ICD-10-CM | POA: Diagnosis not present

## 2016-03-12 DIAGNOSIS — Z87891 Personal history of nicotine dependence: Secondary | ICD-10-CM | POA: Diagnosis not present

## 2016-03-12 DIAGNOSIS — Z79899 Other long term (current) drug therapy: Secondary | ICD-10-CM | POA: Diagnosis not present

## 2016-03-12 DIAGNOSIS — E785 Hyperlipidemia, unspecified: Secondary | ICD-10-CM | POA: Insufficient documentation

## 2016-03-12 DIAGNOSIS — Z419 Encounter for procedure for purposes other than remedying health state, unspecified: Secondary | ICD-10-CM

## 2016-03-12 DIAGNOSIS — I10 Essential (primary) hypertension: Secondary | ICD-10-CM | POA: Diagnosis not present

## 2016-03-12 HISTORY — PX: ANTERIOR CERVICAL DECOMP/DISCECTOMY FUSION: SHX1161

## 2016-03-12 SURGERY — ANTERIOR CERVICAL DECOMPRESSION/DISCECTOMY FUSION 1 LEVEL
Anesthesia: General

## 2016-03-12 MED ORDER — METOPROLOL TARTRATE 12.5 MG HALF TABLET
12.5000 mg | ORAL_TABLET | Freq: Two times a day (BID) | ORAL | Status: DC
  Administered 2016-03-12: 12.5 mg via ORAL
  Filled 2016-03-12: qty 1

## 2016-03-12 MED ORDER — MORPHINE SULFATE (PF) 2 MG/ML IV SOLN: 1.0000 mg | INTRAVENOUS | Status: DC | PRN

## 2016-03-12 MED ORDER — PRAVASTATIN SODIUM 40 MG PO TABS
40.0000 mg | ORAL_TABLET | Freq: Every evening | ORAL | Status: DC
  Administered 2016-03-12: 40 mg via ORAL
  Filled 2016-03-12: qty 1

## 2016-03-12 MED ORDER — BISACODYL 5 MG PO TBEC: 5.0000 mg | DELAYED_RELEASE_TABLET | Freq: Every day | ORAL | Status: DC | PRN

## 2016-03-12 MED ORDER — SUGAMMADEX SODIUM 200 MG/2ML IV SOLN
INTRAVENOUS | Status: DC | PRN
  Administered 2016-03-12: 150 mg via INTRAVENOUS

## 2016-03-12 MED ORDER — ROCURONIUM BROMIDE 50 MG/5ML IV SOLN
INTRAVENOUS | Status: AC
  Filled 2016-03-12: qty 2

## 2016-03-12 MED ORDER — SENNOSIDES-DOCUSATE SODIUM 8.6-50 MG PO TABS
1.0000 | ORAL_TABLET | Freq: Every evening | ORAL | Status: DC | PRN
  Administered 2016-03-13: 1 via ORAL
  Filled 2016-03-12: qty 1

## 2016-03-12 MED ORDER — CEFAZOLIN SODIUM-DEXTROSE 2-4 GM/100ML-% IV SOLN
2.0000 g | INTRAVENOUS | Status: AC
  Administered 2016-03-12: 2 g via INTRAVENOUS
  Filled 2016-03-12: qty 100

## 2016-03-12 MED ORDER — PHENYLEPHRINE 40 MCG/ML (10ML) SYRINGE FOR IV PUSH (FOR BLOOD PRESSURE SUPPORT)
PREFILLED_SYRINGE | INTRAVENOUS | Status: AC
  Filled 2016-03-12: qty 10

## 2016-03-12 MED ORDER — FENTANYL CITRATE (PF) 100 MCG/2ML IJ SOLN: 25.0000 ug | INTRAMUSCULAR | Status: DC | PRN

## 2016-03-12 MED ORDER — PHENYLEPHRINE HCL 10 MG/ML IJ SOLN
INTRAMUSCULAR | Status: DC | PRN
  Administered 2016-03-12: 120 ug via INTRAVENOUS
  Administered 2016-03-12 (×2): 40 ug via INTRAVENOUS
  Administered 2016-03-12 (×2): 80 ug via INTRAVENOUS
  Administered 2016-03-12: 120 ug via INTRAVENOUS
  Administered 2016-03-12: 160 ug via INTRAVENOUS

## 2016-03-12 MED ORDER — ALUM & MAG HYDROXIDE-SIMETH 200-200-20 MG/5ML PO SUSP: 30.0000 mL | Freq: Four times a day (QID) | ORAL | Status: DC | PRN

## 2016-03-12 MED ORDER — THROMBIN 20000 UNITS EX SOLR
CUTANEOUS | Status: AC
  Filled 2016-03-12: qty 20000

## 2016-03-12 MED ORDER — OXYCODONE-ACETAMINOPHEN 5-325 MG PO TABS
1.0000 | ORAL_TABLET | ORAL | Status: DC | PRN
  Administered 2016-03-12: 1 via ORAL
  Filled 2016-03-12: qty 1

## 2016-03-12 MED ORDER — EPHEDRINE 5 MG/ML INJ
INTRAVENOUS | Status: AC
  Filled 2016-03-12: qty 10

## 2016-03-12 MED ORDER — SODIUM CHLORIDE 0.9 % IV SOLN: 250.0000 mL | INTRAVENOUS | Status: DC

## 2016-03-12 MED ORDER — FLEET ENEMA 7-19 GM/118ML RE ENEM: 1.0000 | ENEMA | Freq: Once | RECTAL | Status: DC | PRN

## 2016-03-12 MED ORDER — EPHEDRINE SULFATE 50 MG/ML IJ SOLN
INTRAMUSCULAR | Status: DC | PRN
  Administered 2016-03-12: 10 mg via INTRAVENOUS

## 2016-03-12 MED ORDER — LACTATED RINGERS IV SOLN
INTRAVENOUS | Status: DC | PRN
  Administered 2016-03-12 (×2): via INTRAVENOUS

## 2016-03-12 MED ORDER — 0.9 % SODIUM CHLORIDE (POUR BTL) OPTIME
TOPICAL | Status: DC | PRN
  Administered 2016-03-12: 1000 mL

## 2016-03-12 MED ORDER — MIDAZOLAM HCL 5 MG/5ML IJ SOLN
INTRAMUSCULAR | Status: DC | PRN
  Administered 2016-03-12: 2 mg via INTRAVENOUS

## 2016-03-12 MED ORDER — BUPIVACAINE-EPINEPHRINE 0.25% -1:200000 IJ SOLN
INTRAMUSCULAR | Status: DC | PRN
  Administered 2016-03-12: 10 mL

## 2016-03-12 MED ORDER — SODIUM CHLORIDE 0.9 % IV SOLN: INTRAVENOUS | Status: DC

## 2016-03-12 MED ORDER — LIDOCAINE 2% (20 MG/ML) 5 ML SYRINGE
INTRAMUSCULAR | Status: AC
  Filled 2016-03-12: qty 5

## 2016-03-12 MED ORDER — PROPOFOL 10 MG/ML IV BOLUS
INTRAVENOUS | Status: DC | PRN
  Administered 2016-03-12: 150 mg via INTRAVENOUS

## 2016-03-12 MED ORDER — MIDAZOLAM HCL 2 MG/2ML IJ SOLN
INTRAMUSCULAR | Status: AC
  Filled 2016-03-12: qty 2

## 2016-03-12 MED ORDER — ACETAMINOPHEN 325 MG PO TABS: 650.0000 mg | ORAL_TABLET | ORAL | Status: DC | PRN

## 2016-03-12 MED ORDER — SODIUM CHLORIDE 0.9% FLUSH
3.0000 mL | Freq: Two times a day (BID) | INTRAVENOUS | Status: DC
  Administered 2016-03-12: 3 mL via INTRAVENOUS

## 2016-03-12 MED ORDER — LIDOCAINE HCL (CARDIAC) 20 MG/ML IV SOLN
INTRAVENOUS | Status: DC | PRN
  Administered 2016-03-12: 100 mg via INTRAVENOUS

## 2016-03-12 MED ORDER — CEFAZOLIN SODIUM 1-5 GM-% IV SOLN
1.0000 g | Freq: Three times a day (TID) | INTRAVENOUS | Status: AC
  Administered 2016-03-12: 1 g via INTRAVENOUS
  Filled 2016-03-12: qty 50

## 2016-03-12 MED ORDER — FENTANYL CITRATE (PF) 250 MCG/5ML IJ SOLN
INTRAMUSCULAR | Status: AC
  Filled 2016-03-12: qty 5

## 2016-03-12 MED ORDER — SUGAMMADEX SODIUM 200 MG/2ML IV SOLN
INTRAVENOUS | Status: AC
  Filled 2016-03-12: qty 2

## 2016-03-12 MED ORDER — FENTANYL CITRATE (PF) 100 MCG/2ML IJ SOLN
INTRAMUSCULAR | Status: DC | PRN
  Administered 2016-03-12 (×2): 25 ug via INTRAVENOUS
  Administered 2016-03-12: 100 ug via INTRAVENOUS

## 2016-03-12 MED ORDER — ACETAMINOPHEN 650 MG RE SUPP: 650.0000 mg | RECTAL | Status: DC | PRN

## 2016-03-12 MED ORDER — ONDANSETRON HCL 4 MG/2ML IJ SOLN: 4.0000 mg | Freq: Once | INTRAMUSCULAR | Status: DC | PRN

## 2016-03-12 MED ORDER — POVIDONE-IODINE 7.5 % EX SOLN: Freq: Once | CUTANEOUS | Status: DC

## 2016-03-12 MED ORDER — OXYCODONE HCL 5 MG PO TABS: 5.0000 mg | ORAL_TABLET | Freq: Once | ORAL | Status: DC | PRN

## 2016-03-12 MED ORDER — HYDROCODONE-ACETAMINOPHEN 5-325 MG PO TABS: 1.0000 | ORAL_TABLET | ORAL | Status: DC | PRN

## 2016-03-12 MED ORDER — ONDANSETRON HCL 4 MG/2ML IJ SOLN: 4.0000 mg | INTRAMUSCULAR | Status: DC | PRN

## 2016-03-12 MED ORDER — DOCUSATE SODIUM 100 MG PO CAPS
100.0000 mg | ORAL_CAPSULE | Freq: Two times a day (BID) | ORAL | Status: DC
Start: 2016-03-12 — End: 2016-03-13
  Administered 2016-03-12 (×2): 100 mg via ORAL
  Filled 2016-03-12 (×2): qty 1

## 2016-03-12 MED ORDER — OXYCODONE HCL 5 MG/5ML PO SOLN: 5.0000 mg | Freq: Once | ORAL | Status: DC | PRN

## 2016-03-12 MED ORDER — THROMBIN 20000 UNITS EX KIT
PACK | CUTANEOUS | Status: DC | PRN
  Administered 2016-03-12: 20000 [IU] via TOPICAL

## 2016-03-12 MED ORDER — ZOLPIDEM TARTRATE 5 MG PO TABS: 5.0000 mg | ORAL_TABLET | Freq: Every evening | ORAL | Status: DC | PRN

## 2016-03-12 MED ORDER — ROCURONIUM BROMIDE 100 MG/10ML IV SOLN
INTRAVENOUS | Status: DC | PRN
  Administered 2016-03-12: 50 mg via INTRAVENOUS
  Administered 2016-03-12 (×2): 20 mg via INTRAVENOUS

## 2016-03-12 MED ORDER — ONDANSETRON HCL 4 MG/2ML IJ SOLN
INTRAMUSCULAR | Status: DC | PRN
  Administered 2016-03-12 (×2): 4 mg via INTRAVENOUS

## 2016-03-12 MED ORDER — HEMOSTATIC AGENTS (NO CHARGE) OPTIME: TOPICAL | Status: DC | PRN

## 2016-03-12 MED ORDER — ONDANSETRON HCL 4 MG/2ML IJ SOLN
INTRAMUSCULAR | Status: AC
  Filled 2016-03-12: qty 4

## 2016-03-12 MED ORDER — PHENOL 1.4 % MT LIQD: 1.0000 | OROMUCOSAL | Status: DC | PRN

## 2016-03-12 MED ORDER — MENTHOL 3 MG MT LOZG: 1.0000 | LOZENGE | OROMUCOSAL | Status: DC | PRN

## 2016-03-12 MED ORDER — NITROGLYCERIN 0.4 MG SL SUBL: 0.4000 mg | SUBLINGUAL_TABLET | SUBLINGUAL | Status: DC | PRN

## 2016-03-12 MED ORDER — PROPOFOL 10 MG/ML IV BOLUS
INTRAVENOUS | Status: AC
  Filled 2016-03-12: qty 20

## 2016-03-12 MED ORDER — DIAZEPAM 5 MG PO TABS: 5.0000 mg | ORAL_TABLET | Freq: Four times a day (QID) | ORAL | Status: DC | PRN

## 2016-03-12 MED ORDER — SODIUM CHLORIDE 0.9% FLUSH: 3.0000 mL | INTRAVENOUS | Status: DC | PRN

## 2016-03-12 SURGICAL SUPPLY — 75 items
BENZOIN TINCTURE PRP APPL 2/3 (GAUZE/BANDAGES/DRESSINGS) ×3 IMPLANT
BIT DRILL NEURO 2X3.1 SFT TUCH (MISCELLANEOUS) ×1 IMPLANT
BIT DRILL SRG 14X2.2XFLT CHK (BIT) ×1 IMPLANT
BIT DRL SRG 14X2.2XFLT CHK (BIT) ×1
BLADE SURG 15 STRL LF DISP TIS (BLADE) ×1 IMPLANT
BLADE SURG 15 STRL SS (BLADE) ×2
BLADE SURG ROTATE 9660 (MISCELLANEOUS) ×3 IMPLANT
BUR MATCHSTICK NEURO 3.0 LAGG (BURR) IMPLANT
CARTRIDGE OIL MAESTRO DRILL (MISCELLANEOUS) ×1 IMPLANT
CLOSURE STERI-STRIP 1/2X4 (GAUZE/BANDAGES/DRESSINGS) ×1
CLOSURE WOUND 1/2 X4 (GAUZE/BANDAGES/DRESSINGS) ×1
CLSR STERI-STRIP ANTIMIC 1/2X4 (GAUZE/BANDAGES/DRESSINGS) ×2 IMPLANT
CORDS BIPOLAR (ELECTRODE) ×3 IMPLANT
COVER SURGICAL LIGHT HANDLE (MISCELLANEOUS) ×3 IMPLANT
CRADLE DONUT ADULT HEAD (MISCELLANEOUS) ×3 IMPLANT
DIFFUSER DRILL AIR PNEUMATIC (MISCELLANEOUS) ×3 IMPLANT
DRAIN JACKSON RD 7FR 3/32 (WOUND CARE) IMPLANT
DRAPE C-ARM 42X72 X-RAY (DRAPES) ×3 IMPLANT
DRAPE POUCH INSTRU U-SHP 10X18 (DRAPES) ×3 IMPLANT
DRAPE SURG 17X23 STRL (DRAPES) ×9 IMPLANT
DRILL BIT SKYLINE 14MM (BIT) ×2
DRILL NEURO 2X3.1 SOFT TOUCH (MISCELLANEOUS) ×3
DURAPREP 26ML APPLICATOR (WOUND CARE) ×3 IMPLANT
ELECT COATED BLADE 2.86 ST (ELECTRODE) ×3 IMPLANT
ELECT REM PT RETURN 9FT ADLT (ELECTROSURGICAL) ×3
ELECTRODE REM PT RTRN 9FT ADLT (ELECTROSURGICAL) ×1 IMPLANT
EVACUATOR SILICONE 100CC (DRAIN) IMPLANT
GAUZE SPONGE 4X4 12PLY STRL (GAUZE/BANDAGES/DRESSINGS) ×3 IMPLANT
GAUZE SPONGE 4X4 16PLY XRAY LF (GAUZE/BANDAGES/DRESSINGS) ×3 IMPLANT
GLOVE BIO SURGEON STRL SZ7 (GLOVE) ×3 IMPLANT
GLOVE BIO SURGEON STRL SZ8 (GLOVE) ×3 IMPLANT
GLOVE BIOGEL PI IND STRL 7.0 (GLOVE) ×2 IMPLANT
GLOVE BIOGEL PI IND STRL 8 (GLOVE) ×1 IMPLANT
GLOVE BIOGEL PI INDICATOR 7.0 (GLOVE) ×4
GLOVE BIOGEL PI INDICATOR 8 (GLOVE) ×2
GOWN STRL REUS W/ TWL LRG LVL3 (GOWN DISPOSABLE) ×1 IMPLANT
GOWN STRL REUS W/ TWL XL LVL3 (GOWN DISPOSABLE) ×1 IMPLANT
GOWN STRL REUS W/TWL LRG LVL3 (GOWN DISPOSABLE) ×2
GOWN STRL REUS W/TWL XL LVL3 (GOWN DISPOSABLE) ×2
INTERLOCK LRDTC CRVCL VBR 8MM (Peek) ×1 IMPLANT
IV CATH 14GX2 1/4 (CATHETERS) ×3 IMPLANT
KIT BASIN OR (CUSTOM PROCEDURE TRAY) ×3 IMPLANT
KIT ROOM TURNOVER OR (KITS) ×3 IMPLANT
LORDOTIC CERVICAL VBR 8MM SM (Peek) ×3 IMPLANT
MANIFOLD NEPTUNE II (INSTRUMENTS) IMPLANT
NEEDLE 27GAX1X1/2 (NEEDLE) ×3 IMPLANT
NEEDLE SPNL 20GX3.5 QUINCKE YW (NEEDLE) ×3 IMPLANT
NS IRRIG 1000ML POUR BTL (IV SOLUTION) ×3 IMPLANT
OIL CARTRIDGE MAESTRO DRILL (MISCELLANEOUS) ×3
PACK ORTHO CERVICAL (CUSTOM PROCEDURE TRAY) ×3 IMPLANT
PAD ARMBOARD 7.5X6 YLW CONV (MISCELLANEOUS) ×6 IMPLANT
PATTIES SURGICAL .5 X.5 (GAUZE/BANDAGES/DRESSINGS) IMPLANT
PATTIES SURGICAL .5 X1 (DISPOSABLE) IMPLANT
PIN DISTRACTION 14 (PIN) ×6 IMPLANT
PLATE SKYLINE 12MM (Plate) ×3 IMPLANT
PUTTY BONE DBX 2.5 MIS (Bone Implant) ×3 IMPLANT
SCREW SKYLINE VAR OS 14MM (Screw) ×12 IMPLANT
SPONGE GAUZE 4X4 12PLY STER LF (GAUZE/BANDAGES/DRESSINGS) ×3 IMPLANT
SPONGE INTESTINAL PEANUT (DISPOSABLE) ×3 IMPLANT
SPONGE SURGIFOAM ABS GEL 100 (HEMOSTASIS) IMPLANT
STRIP CLOSURE SKIN 1/2X4 (GAUZE/BANDAGES/DRESSINGS) ×2 IMPLANT
SURGIFLO W/THROMBIN 8M KIT (HEMOSTASIS) ×3 IMPLANT
SUT MNCRL AB 4-0 PS2 18 (SUTURE) IMPLANT
SUT SILK 4 0 (SUTURE)
SUT SILK 4-0 18XBRD TIE 12 (SUTURE) IMPLANT
SUT VIC AB 2-0 CT2 18 VCP726D (SUTURE) ×3 IMPLANT
SYR BULB IRRIGATION 50ML (SYRINGE) ×3 IMPLANT
SYR CONTROL 10ML LL (SYRINGE) ×6 IMPLANT
TAPE CLOTH 4X10 WHT NS (GAUZE/BANDAGES/DRESSINGS) ×3 IMPLANT
TAPE CLOTH SURG 4X10 WHT LF (GAUZE/BANDAGES/DRESSINGS) ×3 IMPLANT
TAPE UMBILICAL COTTON 1/8X30 (MISCELLANEOUS) ×3 IMPLANT
TOWEL OR 17X24 6PK STRL BLUE (TOWEL DISPOSABLE) ×3 IMPLANT
TOWEL OR 17X26 10 PK STRL BLUE (TOWEL DISPOSABLE) ×3 IMPLANT
WATER STERILE IRR 1000ML POUR (IV SOLUTION) IMPLANT
YANKAUER SUCT BULB TIP NO VENT (SUCTIONS) ×3 IMPLANT

## 2016-03-12 NOTE — Op Note (Signed)
George Burns, George NO.:  Burns  MEDICAL RECORD NO.:  [id number]  LOCATION:  3C07C                        FACILITY:  [facility]  PHYSICIAN:  George Bamberg, MD      DATE OF BIRTH:  October 06, 1951  DATE OF PROCEDURE:  03/12/2016                              OPERATIVE REPORT   PREOPERATIVE DIAGNOSES: 1. Right-sided C8 radiculopathy. 2. Right-sided C7-T1 intervertebral disk herniation, severely     compressing the right C8 nerve and the right hemi-cord.  POSTOPERATIVE DIAGNOSES: 1. Right-sided C8 radiculopathy. 2. Right-sided C7-T1 intervertebral disk herniation, severely     compressing the right C8 nerve and the right hemi-cord.  PROCEDURE: 1. Anterior cervical decompression and fusion, C7-T1. 2. Placement of anterior instrumentation, C7-T1. 3. Insertion of interbody device x1 (8 mm, small, Lordotic Titan     intervertebral spacer). 4. Use of morselized allograft-DBS mix. 5. Intraoperative use of fluoroscopy.  SURGEON:  George Bamberg, MD.  ASSISTANTJason Coop, PA-C.  ANESTHESIA:  General endotracheal anesthesia.  COMPLICATIONS:  None.  DISPOSITION:  Stable.  ESTIMATED BLOOD LOSS:  Minimal.  INDICATIONS FOR SURGERY:  Briefly, George Burns is a very pleasant 65- year-old male, who did present to me on January 08, 2016, status post a work injury that did occur on November 08, 2015, when he was lifting a heavy object.  He immediately noticed pain in the neck and right arm. An MRI did reveal severe compression of the right C8 nerve secondary to a prominent right C7-T1 disc herniation.  The patient was also noted to have weakness on examination.  We did initially proceed with an injection, but his improvement was only very temporary.  Given his ongoing weakness and pain, we did discuss proceeding with the procedure reflected above.  The patient was fully aware of the risks and limitations of surgery, and did elect to proceed.  OPERATIVE DETAILS:  On  March 12, 2016, the patient was brought to surgery and general endotracheal anesthesia was administered.  The patient was placed supine on the hospital bed.  Antibiotics were given.  The neck was gently extended.  The neck was prepped and draped in the usual sterile fashion.  I then made a left-sided transverse incision in line with the C7-T1 intervertebral space.  The platysma was incised.  A Clementeen Graham approach was used.  The anterior spine was noted.  A lateral intraoperative fluoroscopic view did confirm the appropriate operative level.  I then placed a self-retaining retractor.  I then placed Caspar pins into the C7 and T1 vertebral bodies.  Distraction was applied across the C7-T1 intervertebral space.  I then used a 15-blade knife to perform an annulotomy at the anterior aspect of the C7-T1 intervertebral space.  I then proceeded with a thorough and complete C7-T1 intervertebral diskectomy.  Of note, upon approaching the right C7-T1 foramen, there was noted to be a disc protrusion with a remnant of the disc extending out the neural foramen.  Using a Kerrison punch, I did grasp the disc and I did remove a very large and substantial intervertebral disk fragment that was clearly sitting directly in the foraminal region.  I did confirm an appropriate decompression using a nerve hook.  I  was very pleased with the decompression that I was able to accomplish.  The endplates were then prepared.  The appropriate size interbody spacer was then packed with DBS mix and tamped into position in the usual fashion.  The Caspar pins were then removed and bone wax was placed in that place.  I then chose a 12 mm anterior cervical plate, placed over the anterior spine.  A 14 mm variable angle screws were placed, 2 in each vertebral body at C7 and T1 for a total of 4 screws. The screws were then locked to the plate.  The wound was then copiously irrigated.  The appropriate level was confirmed using  intraoperative fluoroscopy.  I then closed the wound in layers using 2-0 Vicryl, followed by 4-0 Monocryl.  Benzoin and Steri-Strips were applied followed by sterile dressing.  All instrument counts were correct at the termination of the procedure.  Of note, George Burns was my assistant throughout surgery, and did aid in retraction, suctioning, and closure from start to finish.     George BambergMark Shafin Pollio, MD     MD/MEDQ  D:  03/12/2016  T:  03/12/2016  Job:  [id number]

## 2016-03-12 NOTE — Anesthesia Postprocedure Evaluation (Signed)
Anesthesia Post Note  Patient: George Burns J Labriola  Procedure(s) Performed: Procedure(s) (LRB): ANTERIOR CERVICAL DECOMPRESSION FUSION CERVICAL 7 -THORACIC 1 WITH INSTRUMENTATION AND ALLOGRAFT (N/A)  Patient location during evaluation: PACU Anesthesia Type: General Level of consciousness: awake and alert Pain management: pain level controlled Vital Signs Assessment: post-procedure vital signs reviewed and stable Respiratory status: spontaneous breathing, nonlabored ventilation, respiratory function stable and patient connected to nasal cannula oxygen Cardiovascular status: blood pressure returned to baseline and stable Postop Assessment: no signs of nausea or vomiting Anesthetic complications: no    Last Vitals:  Filed Vitals:   03/12/16 1100 03/12/16 1130  BP: 146/82 126/74  Pulse: 64 58  Temp:  36.3 C  Resp: 11 18    Last Pain:  Filed Vitals:   03/12/16 1131  PainSc: 2                  [name], [name]

## 2016-03-12 NOTE — Anesthesia Preprocedure Evaluation (Signed)
Anesthesia Evaluation  Patient identified by MRN, date of birth, ID band Patient awake    Reviewed: Allergy & Precautions, H&P , NPO status , Patient's Chart, lab work & pertinent test results  History of Anesthesia Complications Negative for: history of anesthetic complications  Airway Mallampati: II  TM Distance: >3 FB Neck ROM: full    Dental no notable dental hx.    Pulmonary neg pulmonary ROS, former smoker,    Pulmonary exam normal breath sounds clear to auscultation       Cardiovascular hypertension, + CAD and + Cardiac Stents  Normal cardiovascular exam Rhythm:regular Rate:Normal  DES to LAD in 2015, is to stop plavix 5 days prior to surgery, continue BB  EF is preserved at 55%   Neuro/Psych negative neurological ROS     GI/Hepatic negative GI ROS, Neg liver ROS,   Endo/Other  negative endocrine ROS  Renal/GU negative Renal ROS     Musculoskeletal   Abdominal   Peds  Hematology negative hematology ROS (+)   Anesthesia Other Findings   Reproductive/Obstetrics negative OB ROS                             Anesthesia Physical Anesthesia Plan  ASA: III  Anesthesia Plan: General   Post-op Pain Management:    Induction: Intravenous  Airway Management Planned: Oral ETT and Video Laryngoscope Planned  Additional Equipment:   Intra-op Plan:   Post-operative Plan:   Informed Consent: I have reviewed the patients History and Physical, chart, labs and discussed the procedure including the risks, benefits and alternatives for the proposed anesthesia with the patient or authorized representative who has indicated his/her understanding and acceptance.   Dental Advisory Given  Plan Discussed with: Anesthesiologist, CRNA and Surgeon  Anesthesia Plan Comments: (Rec BB day of surgery, goal HR 60-80 given CAD with stent  Video laryngoscope planned if symptomatic from cervical  pathology)        Anesthesia Quick Evaluation

## 2016-03-12 NOTE — Anesthesia Procedure Notes (Signed)
Procedure Name: Intubation Date/Time: 03/12/2016 9:01 AM Performed by: Army FossaPULLIAM, Anthony Tamburo DANE Pre-anesthesia Checklist: Patient identified, Emergency Drugs available, Suction available, Patient being monitored and Timeout performed Patient Re-evaluated:Patient Re-evaluated prior to inductionOxygen Delivery Method: Circle system utilized Preoxygenation: Pre-oxygenation with 100% oxygen Intubation Type: IV induction Ventilation: Mask ventilation without difficulty and Oral airway inserted - appropriate to patient size Laryngoscope Size: Glidescope and 4 Grade View: Grade I Tube size: 7.0 mm Number of attempts: 1 Airway Equipment and Method: Patient positioned with wedge pillow,  Stylet and Video-laryngoscopy Placement Confirmation: positive ETCO2,  CO2 detector and breath sounds checked- equal and bilateral (Video confirmation of ETT in appropriate position.  ) Secured at: 21 cm Tube secured with: Tape Dental Injury: Teeth and Oropharynx as per pre-operative assessment

## 2016-03-12 NOTE — Progress Notes (Signed)
Interpreter not present when patient arrived to short stay.  Nurse offered to call interpreter line but patient refuses and states his wife can interpret.  Patient states he speaks some [place].  Patient and wife signed interpreter waiver.

## 2016-03-12 NOTE — Transfer of Care (Signed)
Immediate Anesthesia Transfer of Care Note  Patient: George Burns J Rask  Procedure(s) Performed: Procedure(s) with comments: ANTERIOR CERVICAL DECOMPRESSION FUSION CERVICAL 7 -THORACIC 1 WITH INSTRUMENTATION AND ALLOGRAFT (N/A) - ANTERIOR CERVICAL DECOMPRESSION FUSION CERVICAL 7 -THORACIC 1 WITH INSTRUMENTATION AND ALLOGRAFT  Patient Location: PACU  Anesthesia Type:General  Level of Consciousness:  sedated, patient cooperative and responds to stimulation  Airway & Oxygen Therapy:Patient Spontanous Breathing and Patient connected to face mask oxgen  Post-op Assessment:  Report given to PACU RN and Post -op Vital signs reviewed and stable  Post vital signs:  Reviewed and stable  Last Vitals:  Filed Vitals:   03/12/16 0706  BP: 147/77  Pulse: 59  Temp: 36.6 C  Resp: 20    Complications: No apparent anesthesia complications

## 2016-03-13 ENCOUNTER — Encounter (HOSPITAL_COMMUNITY): Payer: Self-pay | Admitting: Orthopedic Surgery

## 2016-03-13 DIAGNOSIS — M5013 Cervical disc disorder with radiculopathy, cervicothoracic region: Secondary | ICD-10-CM | POA: Diagnosis not present

## 2016-03-13 MED FILL — Thrombin For Soln 20000 Unit: CUTANEOUS | Qty: 1 | Status: AC

## 2016-03-13 NOTE — Progress Notes (Signed)
Patient alert and oriented with no c/o pain. MAE's well, voiding adequately. Discharge home with spouse at bedside. Script and discharge instructions given to patient. Patient and family stated understanding of instructions given. Patient has appointment with physician in 2 weeks

## 2016-03-13 NOTE — Progress Notes (Signed)
    Patient doing well Right arm pain resolved Minimal neck pain   Physical Exam: Filed Vitals:   03/13/16 0007 03/13/16 0420  BP: 114/66 127/81  Pulse: 73 66  Temp: 97.9 F (36.6 C) 98.2 F (36.8 C)  Resp: 18 20    Dressing in place NVI Neck soft/supple  POD #1 s/p C7/T1 ACDF with resolved R arm pain, doing well  - encourage ambulation - Percocet for pain, Valium for muscle spasms - d/c home today, f/u in 2 weeks

## 2016-03-24 ENCOUNTER — Encounter (HOSPITAL_COMMUNITY): Payer: Self-pay | Admitting: Orthopedic Surgery

## 2016-03-27 NOTE — Discharge Summary (Signed)
Patient ID: George Burns MRN: [id number] DOB/AGE: Aug 19, 1951 65 y.o.  Admit date: 03/12/2016 Discharge date: 03/13/2016  Admission Diagnoses:  Active Problems:   Radiculopathy   Discharge Diagnoses:  Same  Past Medical History  Diagnosis Date  . Hypertension   . Gout   . Intermediate coronary syndrome (HCC)   . Renal cyst, left   . Elevated LFTs   . HLD (hyperlipidemia)   . Colon polyp     Surgeries: Procedure(s): ANTERIOR CERVICAL DECOMPRESSION FUSION CERVICAL 7 -THORACIC 1 WITH INSTRUMENTATION AND ALLOGRAFT on 03/12/2016   Consultants:  none  Discharged Condition: Improved  Hospital Course: George Burns is an 65 y.o. male who was admitted 03/12/2016 for operative treatment of radiculopathy. Patient has severe unremitting pain that affects sleep, daily activities, and work/hobbies. After pre-op clearance the patient was taken to the operating room on 03/12/2016 and underwent  Procedure(s): ANTERIOR CERVICAL DECOMPRESSION FUSION CERVICAL 7 -THORACIC 1 WITH INSTRUMENTATION AND ALLOGRAFT.    Patient was given perioperative antibiotics:  Anti-infectives    Start     Dose/Rate Route Frequency Ordered Stop   03/12/16 1130  ceFAZolin (ANCEF) IVPB 1 g/50 mL premix     1 g 100 mL/hr over 30 Minutes Intravenous Every 8 hours 03/12/16 1124 03/13/16 0329   03/12/16 0558  ceFAZolin (ANCEF) IVPB 2g/100 mL premix     2 g 200 mL/hr over 30 Minutes Intravenous On call to O.R. 03/12/16 [id number] 03/12/16 0841       Patient was given sequential compression devices, early ambulation to prevent DVT.  Patient benefited maximally from hospital stay and there were no complications.    Recent vital signs: BP 133/82 mmHg  Pulse 93  Temp(Src) 98 F (36.7 C) (Oral)  Resp 18  Ht 5\' 6"  (1.676 m)  Wt 71.215 kg (157 lb)  BMI 25.35 kg/m2  SpO2 98%  Discharge Medications:     Medication List    STOP taking these medications        clopidogrel 75 MG tablet  Commonly known as:   PLAVIX      TAKE these medications        co-enzyme Q-10 50 MG capsule  Take 100 mg by mouth 2 (two) times daily.     metoprolol tartrate 25 MG tablet  Commonly known as:  LOPRESSOR  Take 0.5 tablets (12.5 mg total) by mouth 2 (two) times daily.     nitroGLYCERIN 0.4 MG SL tablet  Commonly known as:  NITROSTAT  Place 1 tablet (0.4 mg total) under the tongue every 5 (five) minutes as needed for chest pain.     pravastatin 40 MG tablet  Commonly known as:  PRAVACHOL  Take 1 tablet (40 mg total) by mouth every evening.        Diagnostic Studies: Dg Chest 2 View  03/06/2016  CLINICAL DATA:  Preoperative x-ray for cervical spine surgery. EXAM: CHEST  2 VIEW COMPARISON:  December 26, 2013 FINDINGS: The heart size and mediastinal contours are within normal limits. Both lungs are clear. The visualized skeletal structures are unremarkable. IMPRESSION: No active cardiopulmonary disease. Electronically Signed   By: George Samavid  Williams Burns M.D   On: 03/06/2016 19:39   Dg Cervical Spine 1 View  03/12/2016  CLINICAL DATA:  C7-T1 ACDF EXAM: DG C-ARM 61-120 MIN; DG CERVICAL SPINE - 1 VIEW COMPARISON:  None. FINDINGS: Single lateral view of the cervical spine submitted. There is anterior metallic localization instrument at the level of lower endplate of  C7. Limited visualization of C7 vertebral body. IMPRESSION: Anterior metallic localization instrument at the level of lower endplate of C7. Limited visualization of C7 vertebral body. Fluoroscopy time was 11 seconds.  Please see the operative report. Electronically Signed   By: George MeadLiviu  Pop M.D.   On: 03/12/2016 10:13   Dg C-arm 1-60 Min  03/12/2016  CLINICAL DATA:  C7-T1 ACDF EXAM: DG C-ARM 61-120 MIN; DG CERVICAL SPINE - 1 VIEW COMPARISON:  None. FINDINGS: Single lateral view of the cervical spine submitted. There is anterior metallic localization instrument at the level of lower endplate of C7. Limited visualization of C7 vertebral body. IMPRESSION: Anterior  metallic localization instrument at the level of lower endplate of C7. Limited visualization of C7 vertebral body. Fluoroscopy time was 11 seconds.  Please see the operative report. Electronically Signed   By: George MeadLiviu  Pop M.D.   On: 03/12/2016 10:13    Disposition: 01-Home or Self Care   POD #1 s/p C7/T1 ACDF with resolved R arm pain, doing well  -Written scripts for pain signed and in chart -D/C instructions sheet printed and in chart -D/C today  -F/U in office 2 weeks   Signed: Georga Burns, George Burns 03/27/2016, 11:03 AM

## 2016-04-22 ENCOUNTER — Telehealth: Payer: Self-pay | Admitting: *Deleted

## 2016-04-22 NOTE — Telephone Encounter (Signed)
Patients wife called to request a refill on the clopidogrel. It is not listed on his current med list but per patients wife he only held this medication for five days before and five days after his surgery which was in June. Ok to reorder? Please advise. Thanks, MI

## 2016-04-22 NOTE — Telephone Encounter (Addendum)
**Note De-Identified George Burns Obfuscation** Looks like Plavix was d/c's at the pts hospital stay on 03/13/16. Please see hospital d/c on 03/13/16.

## 2016-04-22 NOTE — Telephone Encounter (Signed)
**Note De-Identified Walsie Smeltz Obfuscation** The pts wife states that they were not advised at the pts hospital d/c on 03/13/16 that the pt should stop taking Plavix and was advised to resume Plavix on 6/12 which the pt did. Is it ok to give refill?

## 2016-04-22 NOTE — Telephone Encounter (Signed)
OK to resume Plavix and refill

## 2016-04-23 MED ORDER — CLOPIDOGREL BISULFATE 75 MG PO TABS: 75.0000 mg | ORAL_TABLET | Freq: Every day | ORAL | Status: DC

## 2016-04-23 NOTE — Telephone Encounter (Signed)
**Note De-Identified Zigmund Linse Obfuscation** I left a message on the pts VM stating that [facility] [address] I also left this office phone number to call if he has any questions.

## 2016-04-23 NOTE — Telephone Encounter (Signed)
Pt's Plavix 75 mg tablet was sent to per Dr. Lillia PaulsVaranasi/ Patricia Via, LPN, pt to resume Plavix and refill. Medication was refilled and sent to pt's pharmacy as requested. Confirmation received.

## 2016-04-24 ENCOUNTER — Other Ambulatory Visit: Payer: Self-pay | Admitting: Interventional Cardiology

## 2016-04-29 ENCOUNTER — Ambulatory Visit (INDEPENDENT_AMBULATORY_CARE_PROVIDER_SITE_OTHER): Payer: 59 | Admitting: Interventional Cardiology

## 2016-04-29 ENCOUNTER — Encounter: Payer: Self-pay | Admitting: Interventional Cardiology

## 2016-04-29 VITALS — BP 110/80 | HR 64 | Ht 66.0 in | Wt 156.2 lb

## 2016-04-29 DIAGNOSIS — E785 Hyperlipidemia, unspecified: Secondary | ICD-10-CM | POA: Diagnosis not present

## 2016-04-29 DIAGNOSIS — I2510 Atherosclerotic heart disease of native coronary artery without angina pectoris: Secondary | ICD-10-CM | POA: Diagnosis not present

## 2016-04-29 DIAGNOSIS — I10 Essential (primary) hypertension: Secondary | ICD-10-CM | POA: Diagnosis not present

## 2016-04-29 MED ORDER — CLOPIDOGREL BISULFATE 75 MG PO TABS
75.0000 mg | ORAL_TABLET | Freq: Every day | ORAL | 3 refills | Status: DC
Start: 2016-04-29 — End: 2017-03-23

## 2016-04-29 MED ORDER — PRAVASTATIN SODIUM 40 MG PO TABS: 40.0000 mg | ORAL_TABLET | Freq: Every evening | ORAL | 3 refills | Status: DC

## 2016-04-29 NOTE — Progress Notes (Signed)
Cardiology Office Note   Date:  04/29/2016   ID:  George Burns, DOB 16-Jun-1951, MRN [id number]  PCP:  Pcp Not In System    No chief complaint on file. f/u CAD   Wt Readings from Last 3 Encounters:  04/29/16 156 lb 3.2 oz (70.9 kg)  03/12/16 157 lb (71.2 kg)  03/06/16 157 lb 3.2 oz (71.3 kg)       History of Present Illness: George Burns is a 65 y.o. male  with a hx of CAD s/p DES to LAD in 12/2013, HTN, HL.  Labs demonstrated significantly increased LFTs while on Lipitor. Lipitor has been stopped. He returns for follow up.  He remains very active at work.  He denies chest pain, significant dyspnea, orthopnea, PND or edema. He denies syncope.  Studies:  - LHC (12/27/13): LAD 99%, EF 50%. PCI: 3.5 x 16 mm Promus premier DES to the LAD    LFTs increased on lipitor. Better LFTs on Pravastatin. LDL 79 on 9/28.    Past Medical History:  Diagnosis Date  . Colon polyp   . Elevated LFTs   . Gout   . HLD (hyperlipidemia)   . Hypertension   . Intermediate coronary syndrome (HCC)   . Renal cyst, left     Past Surgical History:  Procedure Laterality Date  . ANTERIOR CERVICAL DECOMP/DISCECTOMY FUSION N/A 03/12/2016   Procedure: ANTERIOR CERVICAL DECOMPRESSION FUSION CERVICAL 7 -THORACIC 1 WITH INSTRUMENTATION AND ALLOGRAFT;  Surgeon: Estill Bamberg, MD;  Location: [facility] OR;  Service: Orthopedics;  Laterality: N/A;  ANTERIOR CERVICAL DECOMPRESSION FUSION CERVICAL 7 -THORACIC 1 WITH INSTRUMENTATION AND ALLOGRAFT  . CARDIAC CATHETERIZATION    . COLONOSCOPY    . coronary stent placement    . LEFT HEART CATHETERIZATION WITH CORONARY ANGIOGRAM N/A 12/27/2013   Procedure: LEFT HEART CATHETERIZATION WITH CORONARY ANGIOGRAM;  Surgeon: Lennette Bihari, MD;  Location: [facility] CATH LAB;  Service: Cardiovascular;  Laterality: N/A;     Current Outpatient Prescriptions  Medication Sig Dispense Refill  . clopidogrel (PLAVIX) 75 MG tablet Take 1 tablet by mouth  daily 30 tablet 11    . metoprolol tartrate (LOPRESSOR) 25 MG tablet Take 0.5 tablets (12.5 mg total) by mouth 2 (two) times daily. 90 tablet 0  . nitroGLYCERIN (NITROSTAT) 0.4 MG SL tablet Place 1 tablet (0.4 mg total) under the tongue every 5 (five) minutes as needed for chest pain. 25 tablet 12   No current facility-administered medications for this visit.     Allergies:   Lipitor [atorvastatin]    Social History:  The patient  reports that he quit smoking about 31 years ago. His smoking use included Cigarettes. He has never used smokeless tobacco. He reports that he drinks alcohol. He reports that he does not use drugs.   Family History:  The patient's family history includes Emphysema in his brother; Heart disease in his mother; Lung cancer in his father.    ROS:  Please see the history of present illness.   Otherwise, review of systems are positive for .   All other systems are reviewed and negative.    PHYSICAL EXAM: VS:  Ht  (1.676 m)   Wt 156 lb 3.2 oz (70.9 kg)   BMI 25.21 kg/m  , BMI Body mass index is 25.21 kg/m. GEN: Well nourished, well developed, in no acute distress  HEENT: normal  Neck: no JVD, carotid bruits, or masses Cardiac: RRR; no murmurs, rubs, or gallops,no edema  Respiratory:  clear to auscultation bilaterally, normal work of breathing GI: soft, nontender, nondistended, + BS MS: no deformity or atrophy  Skin: warm and dry, no rash Neuro:  Right hand weakness Psych: euthymic mood, full affect   EKG:   The ekg ordered today demonstrates NSR, no ST segment changes   Recent Labs: 03/06/2016: ALT 24; BUN 14; Creatinine, Ser 1.08; Hemoglobin 14.3; Platelets 166; Potassium 5.2; Sodium 141   Lipid Panel    Component Value Date/Time   CHOL 156 07/12/2015 0739   TRIG 115 07/12/2015 0739   HDL 46 07/12/2015 0739   CHOLHDL 3.4 07/12/2015 0739   VLDL 23 07/12/2015 0739   LDLCALC 87 07/12/2015 0739     Other studies Reviewed: Additional studies/ records that were  reviewed today with results demonstrating: cath results.   ASSESSMENT AND PLAN:  1. CAD: No angina.  Continue aggressive secondary prevention.   2. Hyperlipidemia: Stay on Pravastatin. LFTs much improved on pravastatin.  Labs from 10/16 and June 2017 reviewed and well controlled. Lipids and LFTs also well controlled.  He stopped his Pravastatin per another MD.  I have asked him to restart this twice a week.  Recheck lipids and liver in 2 months. 3. No problems with epidural and cervical spine surgery.  Back on clopidogrel. 4. HTN: BP was elevated with neck pain and steroids.  Now it is much better.  Continue metoprolol.  Starting PT today for hand.   Current medicines are reviewed at length with the patient today.  The patient concerns regarding his medicines were addressed.  The following changes have been made:  No change  Labs/ tests ordered today include:  No orders of the defined types were placed in this encounter.   Recommend 150 minutes/week of aerobic exercise Low fat, low carb, high fiber diet recommended  Disposition:   FU in 1 year   Signed, Lance Muss, MD  04/29/2016 1:21 PM    [facility] [address] [place], [place], [place]  [address] Phone: [phone]; Fax: [phone]

## 2016-04-29 NOTE — Patient Instructions (Signed)
**Note De-Identified Cayden Granholm Obfuscation** Medication Instructions:  Restart Pravastatin 40 mg-take 1 tablet twice a week All other medications remain the same  Labwork: In 2 months-Please do not eat or drink after midnight the night before.  Testing/Procedures: None  Follow-Up: Your physician wants you to follow-up in: 1 year. You will receive a reminder letter in the mail two months in advance. If you don't receive a letter, please call our office to schedule the follow-up appointment.   Any Other Special Instructions Will Be Listed Below (If Applicable).     If you need a refill on your cardiac medications before your next appointment, please call your pharmacy.

## 2016-06-30 ENCOUNTER — Other Ambulatory Visit: Payer: 59 | Admitting: *Deleted

## 2016-06-30 DIAGNOSIS — E785 Hyperlipidemia, unspecified: Secondary | ICD-10-CM

## 2016-06-30 LAB — LIPID PANEL
CHOLESTEROL: 199 mg/dL (ref 125–200)
HDL: 51 mg/dL (ref >=40)
LDL Cholesterol: 116 mg/dL (ref <130)
Total CHOL/HDL Ratio: 3.9 Ratio (ref <=5.0)
Triglycerides: 161 mg/dL — ABNORMAL HIGH (ref <150)
VLDL: 32 mg/dL — ABNORMAL HIGH (ref <30)

## 2016-06-30 LAB — HEPATIC FUNCTION PANEL
ALT: 16 U/L (ref 9–46)
AST: 18 U/L (ref 10–35)
Albumin: 4.5 g/dL (ref 3.6–5.1)
Alkaline Phosphatase: 55 U/L (ref 40–115)
Bilirubin, Direct: 0.1 mg/dL
Indirect Bilirubin: 0.5 mg/dL (ref 0.2–1.2)
Total Bilirubin: 0.6 mg/dL (ref 0.2–1.2)
Total Protein: 6.8 g/dL (ref 6.1–8.1)

## 2016-07-03 ENCOUNTER — Other Ambulatory Visit: Payer: Self-pay

## 2016-07-03 DIAGNOSIS — E785 Hyperlipidemia, unspecified: Secondary | ICD-10-CM

## 2016-10-29 ENCOUNTER — Other Ambulatory Visit: Payer: 59

## 2016-12-25 ENCOUNTER — Other Ambulatory Visit: Payer: 59 | Admitting: *Deleted

## 2016-12-25 DIAGNOSIS — E785 Hyperlipidemia, unspecified: Secondary | ICD-10-CM

## 2016-12-25 LAB — HEPATIC FUNCTION PANEL
ALBUMIN: 4.5 g/dL (ref 3.6–4.8)
ALT: 29 IU/L (ref 0–44)
AST: 29 IU/L (ref 0–40)
Alkaline Phosphatase: 61 IU/L (ref 39–117)
Bilirubin Total: 0.6 mg/dL (ref 0.0–1.2)
Bilirubin, Direct: 0.17 mg/dL (ref 0.00–0.40)
TOTAL PROTEIN: 6.9 g/dL (ref 6.0–8.5)

## 2016-12-25 LAB — LIPID PANEL
CHOL/HDL RATIO: 3.9 ratio (ref 0.0–5.0)
Cholesterol, Total: 216 mg/dL — ABNORMAL HIGH (ref 100–199)
HDL: 56 mg/dL (ref >39)
LDL Calculated: 137 mg/dL — ABNORMAL HIGH (ref 0–99)
Triglycerides: 117 mg/dL (ref 0–149)
VLDL CHOLESTEROL CAL: 23 mg/dL (ref 5–40)

## 2016-12-25 NOTE — Addendum Note (Signed)
Addended by: Tonita PhoenixBOWDEN, ROBIN K on: 12/25/2016 07:35 AM   Modules accepted: Orders

## 2017-01-04 IMAGING — RF DG CERVICAL SPINE 1V
1 series · 1 of 1 positions shown · non-contrast
Comparison: None.

CLINICAL DATA: C7-T1 ACDF

EXAM:
DG C-ARM 61-120 MIN; DG CERVICAL SPINE - 1 VIEW

[Series 1: run · 1 of 1 slices shown]
[im 1/1]
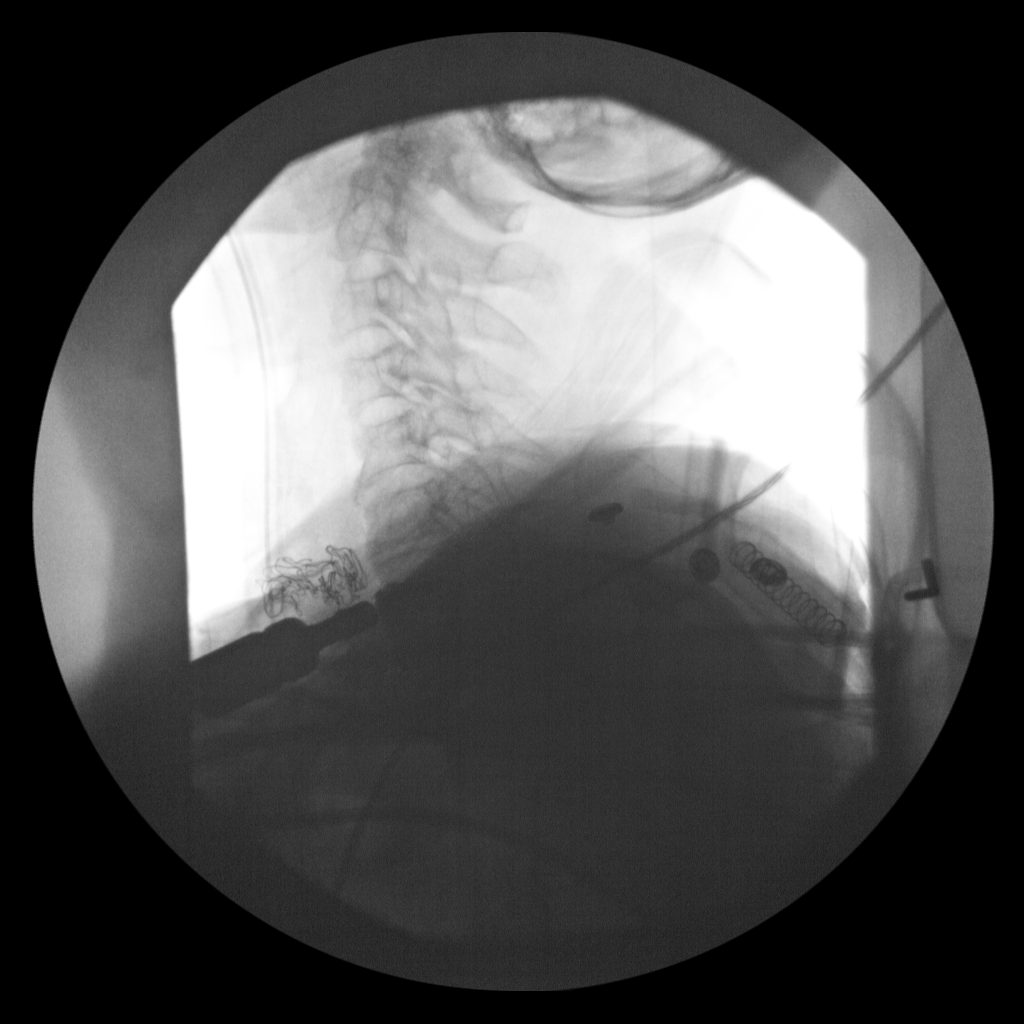

[1 of 1 positions shown; findings below may reference images not displayed]

FINDINGS: Single lateral view of the cervical spine submitted. There is
anterior metallic localization instrument at the level of lower
endplate of C7. Limited visualization of C7 vertebral body.
IMPRESSION: Anterior metallic localization instrument at the level of lower
endplate of C7. Limited visualization of C7 vertebral body.

Fluoroscopy time was 11 seconds.  Please see the operative report.

## 2017-02-27 ENCOUNTER — Other Ambulatory Visit: Payer: Self-pay | Admitting: Orthopedic Surgery

## 2017-03-03 ENCOUNTER — Telehealth: Payer: Self-pay

## 2017-03-03 NOTE — Telephone Encounter (Signed)
Request for surgical clearance:  1. What type of surgery is being performed? Right Long and Ring Finger Trigger Release, Right  Ulnar Nerve Decompression, possible transposition   2. When is this surgery scheduled? 04/30/17   3. Are there any medications that need to be held prior to surgery and how long? Plavix   4. Name of physician performing surgery? Dr. Betha LoaKevin Kuzma   5. What is your office phone and fax number? P- [phone], [phone]- [phone]

## 2017-03-04 NOTE — Telephone Encounter (Signed)
No further cardiac testing needed before surgery.  OK to hold Plavix for 5 days.

## 2017-03-04 NOTE — Telephone Encounter (Signed)
[facility] [place]. Confirmation received that fax went through.

## 2017-03-23 ENCOUNTER — Other Ambulatory Visit: Payer: Self-pay | Admitting: Interventional Cardiology

## 2017-04-07 ENCOUNTER — Encounter: Payer: Self-pay | Admitting: *Deleted

## 2017-04-21 ENCOUNTER — Encounter: Payer: Self-pay | Admitting: Interventional Cardiology

## 2017-04-21 ENCOUNTER — Ambulatory Visit (INDEPENDENT_AMBULATORY_CARE_PROVIDER_SITE_OTHER): Payer: 59 | Admitting: Interventional Cardiology

## 2017-04-21 VITALS — BP 124/70 | HR 72 | Ht 66.0 in | Wt 167.8 lb

## 2017-04-21 DIAGNOSIS — Z01810 Encounter for preprocedural cardiovascular examination: Secondary | ICD-10-CM | POA: Diagnosis not present

## 2017-04-21 DIAGNOSIS — E785 Hyperlipidemia, unspecified: Secondary | ICD-10-CM

## 2017-04-21 DIAGNOSIS — I25119 Atherosclerotic heart disease of native coronary artery with unspecified angina pectoris: Secondary | ICD-10-CM | POA: Diagnosis not present

## 2017-04-21 MED ORDER — CLOPIDOGREL BISULFATE 75 MG PO TABS: 75.0000 mg | ORAL_TABLET | Freq: Every day | ORAL | 3 refills | Status: DC

## 2017-04-21 NOTE — Progress Notes (Signed)
Cardiology Office Note   Date:  04/21/2017   ID:  George JunkerStanislaw J Jubb, DOB 16-Apr-1951, MRN [id number]  PCP:  Tracey HarriesBouska, David, MD    Chief Complaint  Patient presents with  . Follow-up   CAD  Wt Readings from Last 3 Encounters:  04/21/17 167 lb 12.8 oz (76.1 kg)  04/29/16 156 lb 3.2 oz (70.9 kg)  03/12/16 157 lb (71.2 kg)       History of Present Illness: George Burns is a 66 y.o. male   with a hx of CAD s/p DES to LAD in 12/2013, HTN, HL.  Labs demonstrated significantly increased LFTs while on Lipitor.  Denies : Chest pain. Dizziness. Leg edema. Nitroglycerin use. Orthopnea. Palpitations. Paroxysmal nocturnal dyspnea. Shortness of breath. Syncope.   He is scheduled for right elbow surgery due to a nerve problem. He currently has right hand weakness.  Due to his injury, he has been less active on his job. He thinks that his weight gain weight. He also had a go to [place] because his mother passed away and 8 more while he was there. This likely contributed to his weight gain.    Past Medical History:  Diagnosis Date  . Colon polyp   . Elevated LFTs   . Gout   . HLD (hyperlipidemia)   . Hypertension   . Intermediate coronary syndrome (HCC)   . Renal cyst, left     Past Surgical History:  Procedure Laterality Date  . ANTERIOR CERVICAL DECOMP/DISCECTOMY FUSION N/A 03/12/2016   Procedure: ANTERIOR CERVICAL DECOMPRESSION FUSION CERVICAL 7 -THORACIC 1 WITH INSTRUMENTATION AND ALLOGRAFT;  Surgeon: Estill BambergMark Dumonski, MD;  Location: [facility] OR;  Service: Orthopedics;  Laterality: N/A;  ANTERIOR CERVICAL DECOMPRESSION FUSION CERVICAL 7 -THORACIC 1 WITH INSTRUMENTATION AND ALLOGRAFT  . CARDIAC CATHETERIZATION    . COLONOSCOPY    . coronary stent placement    . LEFT HEART CATHETERIZATION WITH CORONARY ANGIOGRAM N/A 12/27/2013   Procedure: LEFT HEART CATHETERIZATION WITH CORONARY ANGIOGRAM;  Surgeon: Lennette Biharihomas A Kelly, MD;  [facility];  Service: Cardiovascular;  Laterality:  N/A;     Current Outpatient Prescriptions  Medication Sig Dispense Refill  . clopidogrel (PLAVIX) 75 MG tablet TAKE 1 TABLET BY MOUTH  DAILY 30 tablet 0  . Coenzyme Q10 (CO Q 10) 100 MG CAPS Take 100 mg by mouth 2 (two) times daily.    . metoprolol tartrate (LOPRESSOR) 25 MG tablet Take 0.5 tablets (12.5 mg total) by mouth 2 (two) times daily. 90 tablet 0  . nitroGLYCERIN (NITROSTAT) 0.4 MG SL tablet Place 1 tablet (0.4 mg total) under the tongue every 5 (five) minutes as needed for chest pain. 25 tablet 12  . nitroGLYCERIN (NITROSTAT) 0.4 MG SL tablet Place 0.4 mg under the tongue every 5 (five) minutes as needed for chest pain (X 3 DOSES FOR CHEST PAIN).    Marland Kitchen. pravastatin (PRAVACHOL) 40 MG tablet Take 1 tablet (40 mg total) by mouth every evening. 50 tablet 3   No current facility-administered medications for this visit.     Allergies:   Lipitor [atorvastatin]    Social History:  The patient  reports that he quit smoking about 32 years ago. His smoking use included Cigarettes. He has never used smokeless tobacco. He reports that he drinks alcohol. He reports that he does not use drugs.   Family History:  The patient's family history includes Emphysema in his brother; Heart disease in his mother; Lung cancer in his father.  ROS:  Please see the history of present illness.   Otherwise, review of systems are positive for Right hand weakness due to elbow problem.   All other systems are reviewed and negative.    PHYSICAL EXAM: VS:  BP 124/70   Pulse 72   Ht 5\' 6"  (1.676 m)   Wt 167 lb 12.8 oz (76.1 kg)   BMI 27.08 kg/m  , BMI Body mass index is 27.08 kg/m. GEN: Well nourished, well developed, in no acute distress  HEENT: normal  Neck: no JVD, carotid bruits, or masses Cardiac: RRR; no murmurs, rubs, or gallops,no edema  Respiratory:  clear to auscultation bilaterally, normal work of breathing GI: soft, nontender, nondistended, + BS MS: no deformity or atrophy ; btrace on right  elbow Skin: warm and dry, no rash Neuro:  Strength and sensation are intact Psych: euthymic mood, full affect   EKG:   The ekg ordered today demonstrates Normal sinus rhythm, inferior Q waves, no ST segment changes, no change from prior   Recent Labs: 12/25/2016: ALT 29   Lipid Panel    Component Value Date/Time   CHOL 216 (H) 12/25/2016 0735   TRIG 117 12/25/2016 0735   HDL 56 12/25/2016 0735   CHOLHDL 3.9 12/25/2016 0735   CHOLHDL 3.9 06/30/2016 0759   VLDL 32 (H) 06/30/2016 0759   LDLCALC 137 (H) 12/25/2016 0735     Other studies Reviewed: Additional studies/ records that were reviewed today with results demonstrating:  Labs reviewed. LAD stent placed in 2015.   ASSESSMENT AND PLAN:  1. CAD : No angina on medical therapy. Continue aggressive secondary prevention. 2. Preoperative evaluation: No further cardiac testing needed before surgery. Okay to hold clopidogrel 5 days prior to surgery. 3. Hyperlipidemia: LDL still above target despite the patient taking pravastatin. Could increase pravastatin to 80 mg daily. Will await until after he has recovered from his surgery. He states that his lipids were well controlled when checked by his primary care doctor in November. He did make a trip to [place] in the interim and 8 more while he was out of the country. Recheck lipids in 3 months to see where we are. Depending on those readings, could increase pravastatin. He did not tolerate atorvastatin in the past due to elevated LFTs.   Current medicines are reviewed at length with the patient today.  The patient concerns regarding his medicines were addressed.  The following changes have been made:  No change  Labs/ tests ordered today include:  No orders of the defined types were placed in this encounter.   Recommend 150 minutes/week of aerobic exercise Low fat, low carb, high fiber diet recommended  Disposition:   FU in 1 year   Signed, Lance Muss, MD  04/21/2017 4:36  PM    [facility] [address] [place], [place], [place]  [address] Phone: [phone]; Fax: [phone]

## 2017-04-21 NOTE — Patient Instructions (Signed)
Medication Instructions:  Your physician recommends that you continue on your current medications as directed. Please refer to the Current Medication list given to you today.   Labwork: Your physician recommends that you return for lab work on October 17th, 2018 for FASTING LIPIDS   Testing/Procedures: None ordered  Follow-Up: Your physician wants you to follow-up in: 1 year with Dr. Eldridge DaceVaranasi. You will receive a reminder letter in the mail two months in advance. If you don't receive a letter, please call our office to schedule the follow-up appointment.   Any Other Special Instructions Will Be Listed Below (If Applicable).     If you need a refill on your cardiac medications before your next appointment, please call your pharmacy.

## 2017-04-24 ENCOUNTER — Encounter (HOSPITAL_BASED_OUTPATIENT_CLINIC_OR_DEPARTMENT_OTHER): Payer: Self-pay | Admitting: *Deleted

## 2017-04-24 NOTE — Progress Notes (Signed)
Patient speaks broken [place], prefers that wife Norman HerrlichStasia be his only interpreter DOS. They will sign waiver form DOS.

## 2017-04-30 ENCOUNTER — Ambulatory Visit (HOSPITAL_BASED_OUTPATIENT_CLINIC_OR_DEPARTMENT_OTHER)
Admission: RE | Admit: 2017-04-30 | Discharge: 2017-04-30 | Disposition: A | Discharge status: Home / Self Care | Payer: Worker's Compensation | Source: Ambulatory Visit | Attending: Orthopedic Surgery | Admitting: Orthopedic Surgery

## 2017-04-30 ENCOUNTER — Telehealth: Payer: Self-pay | Admitting: Interventional Cardiology

## 2017-04-30 ENCOUNTER — Encounter (HOSPITAL_BASED_OUTPATIENT_CLINIC_OR_DEPARTMENT_OTHER): Payer: Self-pay | Admitting: Anesthesiology

## 2017-04-30 ENCOUNTER — Ambulatory Visit (HOSPITAL_BASED_OUTPATIENT_CLINIC_OR_DEPARTMENT_OTHER): Payer: Worker's Compensation | Admitting: Anesthesiology

## 2017-04-30 ENCOUNTER — Encounter (HOSPITAL_BASED_OUTPATIENT_CLINIC_OR_DEPARTMENT_OTHER)
Admission: RE | Disposition: A | Discharge status: Home / Self Care | Payer: Self-pay | Source: Ambulatory Visit | Attending: Orthopedic Surgery

## 2017-04-30 DIAGNOSIS — E785 Hyperlipidemia, unspecified: Secondary | ICD-10-CM | POA: Insufficient documentation

## 2017-04-30 DIAGNOSIS — I2510 Atherosclerotic heart disease of native coronary artery without angina pectoris: Secondary | ICD-10-CM | POA: Diagnosis not present

## 2017-04-30 DIAGNOSIS — Z7902 Long term (current) use of antithrombotics/antiplatelets: Secondary | ICD-10-CM | POA: Insufficient documentation

## 2017-04-30 DIAGNOSIS — Z87891 Personal history of nicotine dependence: Secondary | ICD-10-CM | POA: Insufficient documentation

## 2017-04-30 DIAGNOSIS — M109 Gout, unspecified: Secondary | ICD-10-CM | POA: Insufficient documentation

## 2017-04-30 DIAGNOSIS — I10 Essential (primary) hypertension: Secondary | ICD-10-CM | POA: Insufficient documentation

## 2017-04-30 DIAGNOSIS — G5621 Lesion of ulnar nerve, right upper limb: Secondary | ICD-10-CM | POA: Insufficient documentation

## 2017-04-30 DIAGNOSIS — Z79899 Other long term (current) drug therapy: Secondary | ICD-10-CM | POA: Insufficient documentation

## 2017-04-30 DIAGNOSIS — D759 Disease of blood and blood-forming organs, unspecified: Secondary | ICD-10-CM | POA: Diagnosis not present

## 2017-04-30 DIAGNOSIS — Z955 Presence of coronary angioplasty implant and graft: Secondary | ICD-10-CM | POA: Insufficient documentation

## 2017-04-30 HISTORY — PX: TRIGGER FINGER RELEASE: SHX641

## 2017-04-30 HISTORY — DX: Atherosclerotic heart disease of native coronary artery without angina pectoris: I25.10

## 2017-04-30 HISTORY — PX: ULNAR NERVE TRANSPOSITION: SHX2595

## 2017-04-30 SURGERY — RELEASE, A1 PULLEY, FOR TRIGGER FINGER
Anesthesia: General | Site: Hand | Laterality: Right

## 2017-04-30 MED ORDER — FENTANYL CITRATE (PF) 100 MCG/2ML IJ SOLN: 25.0000 ug | INTRAMUSCULAR | Status: DC | PRN

## 2017-04-30 MED ORDER — MIDAZOLAM HCL 2 MG/2ML IJ SOLN
1.0000 mg | INTRAMUSCULAR | Status: DC | PRN
  Administered 2017-04-30: 2 mg via INTRAVENOUS

## 2017-04-30 MED ORDER — SCOPOLAMINE 1 MG/3DAYS TD PT72: 1.0000 | MEDICATED_PATCH | Freq: Once | TRANSDERMAL | Status: DC | PRN

## 2017-04-30 MED ORDER — LIDOCAINE 2% (20 MG/ML) 5 ML SYRINGE
INTRAMUSCULAR | Status: DC | PRN
  Administered 2017-04-30: 100 mg via INTRAVENOUS

## 2017-04-30 MED ORDER — BUPIVACAINE HCL (PF) 0.25 % IJ SOLN
INTRAMUSCULAR | Status: DC | PRN
  Administered 2017-04-30: 10 mL

## 2017-04-30 MED ORDER — PROPOFOL 500 MG/50ML IV EMUL
INTRAVENOUS | Status: AC
  Filled 2017-04-30: qty 50

## 2017-04-30 MED ORDER — LACTATED RINGERS IV SOLN
INTRAVENOUS | Status: DC
  Administered 2017-04-30: 08:00:00 via INTRAVENOUS

## 2017-04-30 MED ORDER — CEFAZOLIN SODIUM-DEXTROSE 2-4 GM/100ML-% IV SOLN
INTRAVENOUS | Status: AC
  Filled 2017-04-30: qty 100

## 2017-04-30 MED ORDER — ONDANSETRON HCL 4 MG/2ML IJ SOLN
INTRAMUSCULAR | Status: DC | PRN
  Administered 2017-04-30: 4 mg via INTRAVENOUS

## 2017-04-30 MED ORDER — DEXAMETHASONE SODIUM PHOSPHATE 10 MG/ML IJ SOLN
INTRAMUSCULAR | Status: DC | PRN
  Administered 2017-04-30: 10 mg via INTRAVENOUS

## 2017-04-30 MED ORDER — BUPIVACAINE HCL (PF) 0.25 % IJ SOLN
INTRAMUSCULAR | Status: AC
  Filled 2017-04-30: qty 60

## 2017-04-30 MED ORDER — FENTANYL CITRATE (PF) 100 MCG/2ML IJ SOLN
50.0000 ug | INTRAMUSCULAR | Status: DC | PRN
  Administered 2017-04-30: 100 ug via INTRAVENOUS
  Administered 2017-04-30: 25 ug via INTRAVENOUS

## 2017-04-30 MED ORDER — CEFAZOLIN SODIUM-DEXTROSE 2-4 GM/100ML-% IV SOLN
2.0000 g | INTRAVENOUS | Status: AC
  Administered 2017-04-30: 2 g via INTRAVENOUS

## 2017-04-30 MED ORDER — DEXAMETHASONE SODIUM PHOSPHATE 10 MG/ML IJ SOLN
INTRAMUSCULAR | Status: AC
  Filled 2017-04-30: qty 1

## 2017-04-30 MED ORDER — FENTANYL CITRATE (PF) 100 MCG/2ML IJ SOLN
INTRAMUSCULAR | Status: AC
  Filled 2017-04-30: qty 2

## 2017-04-30 MED ORDER — MIDAZOLAM HCL 2 MG/2ML IJ SOLN
INTRAMUSCULAR | Status: AC
  Filled 2017-04-30: qty 2

## 2017-04-30 MED ORDER — ONDANSETRON HCL 4 MG/2ML IJ SOLN
INTRAMUSCULAR | Status: AC
  Filled 2017-04-30: qty 2

## 2017-04-30 MED ORDER — HYDROCODONE-ACETAMINOPHEN 5-325 MG PO TABS: ORAL_TABLET | ORAL | 0 refills | Status: DC

## 2017-04-30 MED ORDER — CHLORHEXIDINE GLUCONATE 4 % EX LIQD: 60.0000 mL | Freq: Once | CUTANEOUS | Status: DC

## 2017-04-30 MED ORDER — PROPOFOL 10 MG/ML IV BOLUS
INTRAVENOUS | Status: DC | PRN
  Administered 2017-04-30: 100 mg via INTRAVENOUS

## 2017-04-30 MED ORDER — ONDANSETRON HCL 4 MG/2ML IJ SOLN: 4.0000 mg | Freq: Once | INTRAMUSCULAR | Status: DC | PRN

## 2017-04-30 SURGICAL SUPPLY — 58 items
BANDAGE ACE 3X5.8 VEL STRL LF (GAUZE/BANDAGES/DRESSINGS) IMPLANT
BANDAGE ACE 4X5 VEL STRL LF (GAUZE/BANDAGES/DRESSINGS) ×4 IMPLANT
BANDAGE COBAN STERILE 2 (GAUZE/BANDAGES/DRESSINGS) ×4 IMPLANT
BLADE MINI RND TIP GREEN BEAV (BLADE) ×4 IMPLANT
BLADE SURG 15 STRL LF DISP TIS (BLADE) ×4 IMPLANT
BLADE SURG 15 STRL SS (BLADE) ×4
BNDG CONFORM 2 STRL LF (GAUZE/BANDAGES/DRESSINGS) ×4 IMPLANT
BNDG ESMARK 4X9 LF (GAUZE/BANDAGES/DRESSINGS) ×4 IMPLANT
BNDG GAUZE ELAST 4 BULKY (GAUZE/BANDAGES/DRESSINGS) ×4 IMPLANT
CHLORAPREP W/TINT 26ML (MISCELLANEOUS) ×4 IMPLANT
CORD BIPOLAR FORCEPS 12FT (ELECTRODE) ×4 IMPLANT
COVER BACK TABLE 60X90IN (DRAPES) ×4 IMPLANT
COVER MAYO STAND STRL (DRAPES) ×4 IMPLANT
CUFF TOURN SGL LL 18 NRW (TOURNIQUET CUFF) ×4 IMPLANT
CUFF TOURNIQUET SINGLE 18IN (TOURNIQUET CUFF) IMPLANT
DECANTER SPIKE VIAL GLASS SM (MISCELLANEOUS) IMPLANT
DRAPE EXTREMITY T 121X128X90 (DRAPE) ×4 IMPLANT
DRAPE SURG 17X23 STRL (DRAPES) ×4 IMPLANT
DRSG PAD ABDOMINAL 8X10 ST (GAUZE/BANDAGES/DRESSINGS) ×4 IMPLANT
GAUZE SPONGE 4X4 12PLY STRL (GAUZE/BANDAGES/DRESSINGS) ×4 IMPLANT
GAUZE SPONGE 4X4 16PLY XRAY LF (GAUZE/BANDAGES/DRESSINGS) IMPLANT
GAUZE XEROFORM 1X8 LF (GAUZE/BANDAGES/DRESSINGS) ×4 IMPLANT
GLOVE BIO SURGEON STRL SZ7.5 (GLOVE) ×4 IMPLANT
GLOVE BIOGEL PI IND STRL 7.0 (GLOVE) ×2 IMPLANT
GLOVE BIOGEL PI IND STRL 8 (GLOVE) ×2 IMPLANT
GLOVE BIOGEL PI IND STRL 8.5 (GLOVE) ×2 IMPLANT
GLOVE BIOGEL PI INDICATOR 7.0 (GLOVE) ×2
GLOVE BIOGEL PI INDICATOR 8 (GLOVE) ×2
GLOVE BIOGEL PI INDICATOR 8.5 (GLOVE) ×2
GLOVE ECLIPSE 6.5 STRL STRAW (GLOVE) ×4 IMPLANT
GLOVE SURG ORTHO 8.0 STRL STRW (GLOVE) ×4 IMPLANT
GOWN STRL REUS W/ TWL LRG LVL3 (GOWN DISPOSABLE) ×2 IMPLANT
GOWN STRL REUS W/TWL LRG LVL3 (GOWN DISPOSABLE) ×2
GOWN STRL REUS W/TWL XL LVL3 (GOWN DISPOSABLE) ×8 IMPLANT
NEEDLE HYPO 25X1 1.5 SAFETY (NEEDLE) ×4 IMPLANT
NS IRRIG 1000ML POUR BTL (IV SOLUTION) ×4 IMPLANT
PACK BASIN DAY SURGERY FS (CUSTOM PROCEDURE TRAY) ×4 IMPLANT
PAD CAST 3X4 CTTN HI CHSV (CAST SUPPLIES) IMPLANT
PAD CAST 4YDX4 CTTN HI CHSV (CAST SUPPLIES) ×2 IMPLANT
PADDING CAST ABS 4INX4YD NS (CAST SUPPLIES) ×2
PADDING CAST ABS COTTON 4X4 ST (CAST SUPPLIES) ×2 IMPLANT
PADDING CAST COTTON 3X4 STRL (CAST SUPPLIES)
PADDING CAST COTTON 4X4 STRL (CAST SUPPLIES) ×2
SLEEVE SCD COMPRESS KNEE MED (MISCELLANEOUS) ×4 IMPLANT
SPLINT FAST PLASTER 5X30 (CAST SUPPLIES)
SPLINT PLASTER CAST FAST 5X30 (CAST SUPPLIES) IMPLANT
SPLINT PLASTER CAST XFAST 3X15 (CAST SUPPLIES) IMPLANT
SPLINT PLASTER XTRA FASTSET 3X (CAST SUPPLIES)
STOCKINETTE 4X48 STRL (DRAPES) ×4 IMPLANT
SUT ETHILON 4 0 PS 2 18 (SUTURE) ×8 IMPLANT
SUT VIC AB 2-0 SH 27 (SUTURE) ×2
SUT VIC AB 2-0 SH 27XBRD (SUTURE) ×2 IMPLANT
SUT VICRYL 4-0 PS2 18IN ABS (SUTURE) IMPLANT
SYR BULB 3OZ (MISCELLANEOUS) ×4 IMPLANT
SYR CONTROL 10ML LL (SYRINGE) ×4 IMPLANT
TOWEL OR 17X24 6PK STRL BLUE (TOWEL DISPOSABLE) ×8 IMPLANT
TOWEL OR NON WOVEN STRL DISP B (DISPOSABLE) ×4 IMPLANT
UNDERPAD 30X30 (UNDERPADS AND DIAPERS) IMPLANT

## 2017-04-30 NOTE — Anesthesia Procedure Notes (Signed)
Procedure Name: LMA Insertion Date/Time: 04/30/2017 8:51 AM Performed by: Burna CashONRAD, Bina Veenstra C Pre-anesthesia Checklist: Patient identified, Emergency Drugs available, Suction available and Patient being monitored Patient Re-evaluated:Patient Re-evaluated prior to induction Oxygen Delivery Method: Circle system utilized Preoxygenation: Pre-oxygenation with 100% oxygen Induction Type: IV induction Ventilation: Mask ventilation without difficulty LMA: LMA inserted LMA Size: 5.0 Number of attempts: 1 Airway Equipment and Method: Bite block Placement Confirmation: positive ETCO2 Tube secured with: Tape Dental Injury: Teeth and Oropharynx as per pre-operative assessment

## 2017-04-30 NOTE — Telephone Encounter (Signed)
Patient's wife notified it is okay for patient to restart plavix. Patient was to only hold for 5 days prior to surgery. Wife verbalized understanding and thanked me for the call.

## 2017-04-30 NOTE — Discharge Instructions (Addendum)

## 2017-04-30 NOTE — Brief Op Note (Signed)
04/30/2017  9:47 AM  PATIENT:  George Burns  66 y.o. male  PRE-OPERATIVE DIAGNOSIS:  Right Long/Ring Trigger Digits  Right Ulnar Nerve Compression at Elbow  M65.331  M65.341  G56.21  POST-OPERATIVE DIAGNOSIS:  Right Long/Ring Trigger Digits  Right Ulnar Nerve Compression at Elbow  M65.331  M65.341  G56.21  PROCEDURE:  Procedure(s): RIGHT LONG AND RING TRIGGER RELEASE (Right) RIGHT ULNAR NERVE DECOMPRESSION (Right)  SURGEON:  Surgeon(s) and Role:    * Betha LoaKuzma, Wakisha Alberts, MD - Primary    * Cindee SaltKuzma, Gary, MD - Assisting  PHYSICIAN ASSISTANT:   ASSISTANTS: Cindee SaltGary Luby Seamans, MD   ANESTHESIA:   general  EBL:  Total I/O In: 400 [I.V.:400] Out: -   BLOOD ADMINISTERED:none  DRAINS: none   LOCAL MEDICATIONS USED:  MARCAINE     SPECIMEN:  No Specimen  DISPOSITION OF SPECIMEN:  N/A  COUNTS:  YES  TOURNIQUET:   Total Tourniquet Time Documented: Upper Arm (Right) - 42 minutes Total: Upper Arm (Right) - 42 minutes   DICTATION: .Other Dictation: Dictation Number [id number]  PLAN OF CARE: Discharge to home after PACU  PATIENT DISPOSITION:  PACU - hemodynamically stable.

## 2017-04-30 NOTE — Anesthesia Preprocedure Evaluation (Addendum)
Anesthesia Evaluation  Patient identified by MRN, date of birth, ID band Patient awake    Reviewed: Allergy & Precautions, NPO status , Patient's Chart, lab work & pertinent test results, reviewed documented beta blocker date and time   Airway Mallampati: II  TM Distance: >3 FB Neck ROM: Full    Dental  (+) Dental Advisory Given, Edentulous Upper, Edentulous Lower   Pulmonary former smoker,    Pulmonary exam normal breath sounds clear to auscultation       Cardiovascular hypertension, Pt. on home beta blockers and Pt. on medications + angina + CAD and + Cardiac Stents  Normal cardiovascular exam Rhythm:Regular Rate:Normal     Neuro/Psych negative neurological ROS  negative psych ROS   GI/Hepatic negative GI ROS, Neg liver ROS,   Endo/Other  negative endocrine ROS  Renal/GU negative Renal ROS     Musculoskeletal negative musculoskeletal ROS (+)   Abdominal   Peds  Hematology  (+) Blood dyscrasia (Plavix), ,   Anesthesia Other Findings Day of surgery medications reviewed with the patient.  Reproductive/Obstetrics                            Anesthesia Physical Anesthesia Plan  ASA: III  Anesthesia Plan: General   Post-op Pain Management:    Induction: Intravenous  PONV Risk Score and Plan: 2 and Ondansetron and Dexamethasone  Airway Management Planned: LMA  Additional Equipment:   Intra-op Plan:   Post-operative Plan: Extubation in OR  Informed Consent: I have reviewed the patients History and Physical, chart, labs and discussed the procedure including the risks, benefits and alternatives for the proposed anesthesia with the patient or authorized representative who has indicated his/her understanding and acceptance.   Dental advisory given  Plan Discussed with: CRNA  Anesthesia Plan Comments: (Risks/benefits of general anesthesia discussed with patient including risk of  damage to teeth, lips, gum, and tongue, nausea/vomiting, allergic reactions to medications, and the possibility of heart attack, stroke and death.  All patient questions answered.  Patient wishes to proceed.)      Anesthesia Quick Evaluation

## 2017-04-30 NOTE — Op Note (Signed)
[name], [name]          ACCOUNT NO.:  [id number]  MEDICAL RECORD NO.:  [id number]  LOCATION:                                 FACILITY:  PHYSICIAN:  George LoaKevin Souleymane Saiki, MD             DATE OF BIRTH:  DATE OF PROCEDURE:  04/30/2017 DATE OF DISCHARGE:                              OPERATIVE REPORT   PREOPERATIVE DIAGNOSIS:  Right long and ring finger trigger digits and ulnar nerve compression at the elbow.  POSTOPERATIVE DIAGNOSIS:  Right long and ring finger trigger digits and ulnar nerve compression at the elbow.  PROCEDURE:   1. Right ulnar nerve decompression at elbow 2. Right long finger trigger release 3. Right ring finger trigger release  SURGEON:  George LoaKevin Dymond Spreen, MD.  ASSISTANT:  Cindee SaltGary Orvill Coulthard, MD.  ANESTHESIA:  General.  INTRAVENOUS FLUIDS:  Per anesthesia flow sheet.  ESTIMATED BLOOD LOSS:  Minimal.  COMPLICATIONS:  None.  SPECIMENS:  None.  TOURNIQUET TIME:  42 minutes.  DISPOSITION:  Stable to PACU.  INDICATIONS:  [name] is a 66 year old male, who has had numbness and tingling in the ulnar side of his hand as well as triggering of the long and ring fingers.  He wishes to have decompression of the ulnar nerve and release of the trigger digits.  Risks, benefits, and alternatives of surgery were discussed including risk of blood loss; infection; damage to nerves, vessels, tendons, ligaments, bone; failure of surgery; need for additional surgery; complications with wound healing; continued pain; recurrence of triggering; and continued numbness.  He voiced understanding of these risks and elected to proceed.  OPERATIVE COURSE:  After being identified preoperatively by myself, the patient and I agreed upon procedure and site of procedure.  Surgical site was marked.  The risks, benefits, and alternatives of surgery were reviewed and he wished to proceed.  Surgical consent had been signed. He was given IV Ancef as preoperative antibiotic prophylaxis.  He  was transported to the operating room and placed on the operating room table in supine position with the right upper extremity on arm board.  General anesthesia was induced by anesthesiologist.  Right upper extremity was prepped and draped in normal sterile orthopedic fashion.  Surgical pause was performed between surgeons, Anesthesia, and operating room staff, and all were in agreement as to the patient, procedure, and site of procedure.  Tourniquet at the proximal aspect of the extremity was inflated to 250 mmHg after exsanguination of the limb with an Esmarch bandage.  Incision was made over the MP joint volarly of the long finger.  This was carried into subcutaneous tissues by spreading technique.  Bipolar electrocautery was used to obtain hemostasis.  The A1 pulley was identified and sharply incised.  The proximal 1-2 mm of the A2 pulley was incised, bent to the pulley to allow better tendon excursion.  The tendons were brought through the wound and separated. The finger was placed through range of motion.  There was no recurrence of triggering.  An additional incision was made over the MP joint of the ring finger.  This was carried into subcutaneous tissues by spreading technique.  The A1 pulley was identified and sharply incised.  The proximal 1-2 mm of the A2 pulley was incised to allow venting of the pulley and better tendon excursion.  The tendons were brought through the wound and separated.  The finger was placed through range of motion, no recurrence of triggering was noted.  The wounds were copiously irrigated with sterile saline and closed with 4-0 nylon in a horizontal mattress fashion.  Attention was turned to the ulnar nerve at the elbow. An incision was made at the medial side of the elbow and carried into subcutaneous tissues by spreading technique.  Bipolar electrocautery was used to obtain hemostasis.  Care was taken to protect cutaneous branches of the nerve.  The  nerve was identified just proximal to the medial epicondyle.  There was an anconeus epitrochlearis.  Osborne's ligament and the anconeus epitrochlearis were released.  The nerve was then decompressed distally.  A finger was placed into the wound to ensure complete decompression, which was the case.  The nerve was then decompressed proximally.  Again, a finger was placed into the wound to ensure complete decompression.  The wound was copiously irrigated with sterile saline.  The volar leaflet of Osborne's ligament was sutured to the posterior skin flap.  This was done to prevent any subluxation of the nerve.  The elbow had been placed through range of motion.  There was no subluxation of the nerve out of the groove.  Care was taken to ensure that suturing of the ligament did not create a new compression area.  The skin was then closed with 4-0 nylon in a horizontal mattress fashion.  The wounds were all injected with 10 mL of 0.25% plain Marcaine to aid in postoperative analgesia.  They were then dressed with sterile Xeroform, 4x4s, and wrapped with a Kerlix and Ace bandage. Tourniquet was deflated at 42 minutes.  Fingertips were pink with brisk capillary refill after deflation of tourniquet.  The operative drapes were broken down.  The patient was awoken from anesthesia safely.  He was transferred back to stretcher and taken to PACU in stable condition. I will see him back in the office in 1 week for postoperative followup. We will give him Norco 5/325 one to two p.o. q.6 hours p.r.n. pain, dispensed #30.     George LoaKevin Jahsiah Carpenter, MD   ______________________________ George LoaKevin Ernie Sagrero, MD    KK/MEDQ  D:  04/30/2017  T:  04/30/2017  Job:  [id number]

## 2017-04-30 NOTE — Transfer of Care (Signed)
Immediate Anesthesia Transfer of Care Note  Patient: George Burns  Procedure(s) Performed: Procedure(s): RIGHT LONG AND RING TRIGGER RELEASE (Right) RIGHT ULNAR NERVE DECOMPRESSION (Right)  Patient Location: PACU  Anesthesia Type:General  Level of Consciousness: sedated  Airway & Oxygen Therapy: Patient Spontanous Breathing and Patient connected to face mask oxygen  Post-op Assessment: Report given to RN and Post -op Vital signs reviewed and stable  Post vital signs: Reviewed and stable  Last Vitals:  Vitals:   04/30/17 0731 04/30/17 0954  BP: 139/77   Pulse: 71   Resp: 18   Temp: 36.5 C (!) (P) 36.4 C    Last Pain:  Vitals:   04/30/17 0731  TempSrc: Oral      Patients Stated Pain Goal: 0 (04/30/17 0731)  Complications: No apparent anesthesia complications

## 2017-04-30 NOTE — Op Note (Signed)
[id number] 

## 2017-04-30 NOTE — H&P (Signed)
George JunkerStanislaw J Burns is an 66 y.o. male.   Chief Complaint: right ulnar nerve compression, long and ring trigger digits HPI: 66 yo rhd male with numbness in ulnar side of hand and triggering of long and ring fingers.  Ultrasound evidence of ulnar nerve compression at the elbow.  He wishes to undergo surgical release of the trigger digits and decompression of the ulnar nerve at the elbow.  Allergies:  Allergies  Allergen Reactions  . Lipitor [Atorvastatin] Other (See Comments)    ELEVATED LFTs    Past Medical History:  Diagnosis Date  . Colon polyp   . Coronary artery disease 2015   DES to LAD  . Elevated LFTs   . Gout   . HLD (hyperlipidemia)   . Hypertension   . Intermediate coronary syndrome (HCC)   . Renal cyst, left     Past Surgical History:  Procedure Laterality Date  . ANTERIOR CERVICAL DECOMP/DISCECTOMY FUSION N/A 03/12/2016   Procedure: ANTERIOR CERVICAL DECOMPRESSION FUSION CERVICAL 7 -THORACIC 1 WITH INSTRUMENTATION AND ALLOGRAFT;  Surgeon: Estill BambergMark Dumonski, MD;  Location: [facility] OR;  Service: Orthopedics;  Laterality: N/A;  ANTERIOR CERVICAL DECOMPRESSION FUSION CERVICAL 7 -THORACIC 1 WITH INSTRUMENTATION AND ALLOGRAFT  . CARDIAC CATHETERIZATION    . COLONOSCOPY    . coronary stent placement    . LEFT HEART CATHETERIZATION WITH CORONARY ANGIOGRAM N/A 12/27/2013   Procedure: LEFT HEART CATHETERIZATION WITH CORONARY ANGIOGRAM;  Surgeon: Lennette Biharihomas A Kelly, MD;  [facility];  Service: Cardiovascular;  Laterality: N/A;    Family History: Family History  Problem Relation Age of Onset  . Heart disease Mother   . Lung cancer Father        smoker  . Emphysema Brother   . Heart attack Neg Hx     Social History:   reports that he quit smoking about 32 years ago. His smoking use included Cigarettes. He has never used smokeless tobacco. He reports that he drinks alcohol. He reports that he does not use drugs.  Medications: Medications Prior to Admission  Medication Sig  Dispense Refill  . clopidogrel (PLAVIX) 75 MG tablet Take 1 tablet (75 mg total) by mouth daily. 90 tablet 3  . Coenzyme Q10 (CO Q 10) 100 MG CAPS Take 100 mg by mouth 2 (two) times daily.    . metoprolol tartrate (LOPRESSOR) 25 MG tablet Take 0.5 tablets (12.5 mg total) by mouth 2 (two) times daily. 90 tablet 0  . pravastatin (PRAVACHOL) 40 MG tablet Take 1 tablet (40 mg total) by mouth every evening. 50 tablet 3  . nitroGLYCERIN (NITROSTAT) 0.4 MG SL tablet Place 0.4 mg under the tongue every 5 (five) minutes as needed for chest pain (X 3 DOSES FOR CHEST PAIN).      No results found for this or any previous visit (from the past 48 hour(s)).  No results found.   A comprehensive review of systems was negative.  Blood pressure 139/77, pulse 71, temperature 97.7 F (36.5 C), temperature source Oral, resp. rate 18, height 5\' 6"  (1.676 m), weight 73.9 kg (163 lb), SpO2 98 %.  General appearance: alert, cooperative and appears stated age Head: Normocephalic, without obvious abnormality, atraumatic Neck: supple, symmetrical, trachea midline Resp: clear to auscultation bilaterally Cardio: regular rate and rhythm GI: non-tender Extremities: Intact sensation and capillary refill all digits.  +epl/fpl/io.  No wounds.  Pulses: 2+ and symmetric Skin: Skin color, texture, turgor normal. No rashes or lesions Neurologic: Grossly normal Incision/Wound:none  Assessment/Plan Right ulnar nerve  compression at elbow and long and ring finger trigger digits.  Non operative and operative treatment options were discussed with the patient and patient wishes to proceed with operative treatment. Risks, benefits, and alternatives of surgery were discussed and the patient agrees with the plan of care.   George Burns R 04/30/2017, 8:36 AM

## 2017-04-30 NOTE — Telephone Encounter (Signed)
New message      When should patient resume his Plavix , he had surgery this morning , leave message on vm if she does not answer

## 2017-04-30 NOTE — Anesthesia Postprocedure Evaluation (Signed)
Anesthesia Post Note  Patient: George Burns  Procedure(s) Performed: Procedure(s) (LRB): RIGHT LONG AND RING TRIGGER RELEASE (Right) RIGHT ULNAR NERVE DECOMPRESSION (Right)     Patient location during evaluation: PACU Anesthesia Type: General Level of consciousness: awake and alert Pain management: pain level controlled Vital Signs Assessment: post-procedure vital signs reviewed and stable Respiratory status: spontaneous breathing, nonlabored ventilation, respiratory function stable and patient connected to nasal cannula oxygen Cardiovascular status: blood pressure returned to baseline and stable Postop Assessment: no signs of nausea or vomiting Anesthetic complications: no    Last Vitals:  Vitals:   04/30/17 1015 04/30/17 1028  BP: 129/73 (!) 153/70  Pulse: (!) 57 (!) 59  Resp: 12 16  Temp:  (!) 36.3 C    Last Pain:  Vitals:   04/30/17 1028  TempSrc: Oral  PainSc: 0-No pain                 Cecile HearingStephen Edward Phyllis Whitefield

## 2017-04-30 NOTE — Op Note (Signed)
I assisted Surgeon(s) and Role:    * Betha LoaKuzma, Kevin, MD - Primary    Cindee Salt* Zekiel Torian, MD - Assisting on the Procedure(s): RIGHT LONG AND RING TRIGGER RELEASE RIGHT ULNAR NERVE DECOMPRESSION on 04/30/2017.  I provided assistance on this case as follows:setup, approach, retraction, support of the extremity, identification and decompression of the nerve, closure and application of the dressings. I was present for the entire case.  Electronically signed by: Nicki ReaperKUZMA,Alica Shellhammer R, MD Date: 04/30/2017 Time: 9:48 AM

## 2017-05-01 ENCOUNTER — Encounter (HOSPITAL_BASED_OUTPATIENT_CLINIC_OR_DEPARTMENT_OTHER): Payer: Self-pay | Admitting: Orthopedic Surgery

## 2017-07-22 ENCOUNTER — Other Ambulatory Visit: Payer: 59 | Admitting: *Deleted

## 2017-07-22 DIAGNOSIS — E785 Hyperlipidemia, unspecified: Secondary | ICD-10-CM

## 2017-07-22 LAB — LIPID PANEL
Chol/HDL Ratio: 4.1 ratio (ref 0.0–5.0)
Cholesterol, Total: 186 mg/dL (ref 100–199)
HDL: 45 mg/dL
LDL Calculated: 114 mg/dL — ABNORMAL HIGH (ref 0–99)
Triglycerides: 133 mg/dL (ref 0–149)
VLDL Cholesterol Cal: 27 mg/dL (ref 5–40)

## 2017-07-23 ENCOUNTER — Telehealth: Payer: Self-pay

## 2017-07-23 DIAGNOSIS — E785 Hyperlipidemia, unspecified: Secondary | ICD-10-CM

## 2017-07-23 NOTE — Telephone Encounter (Signed)
Notes recorded by Phineas Semenobertson, Joseline Mccampbell, RN on 07/23/2017 at 12:07 PM EDT Spoke with patient wife (DPR on file) regarding lipid results, Instructed pt's wife to increase pravastatin to 80 mg daily and a repeat lipid and liver test in the 3 months. Patient's wife is refusing to increase dose, pt's wife stated in the past the patient experience liver problems with increased statin dosage(made pt's wife aware that liver enzymes back in March 2018 were normal in the past). Patient's wife would like to do diet control along with exercise and follow up with a lipid in 3 months. Lab schedule made for 10/28/17. ------  Notes recorded by Phineas Semenobertson, Sathvik Tiedt, RN on 07/23/2017 at 10:06 AM EDT Left message for patient to call back ------  Notes recorded by Corky CraftsVaranasi, Jayadeep S, MD on 07/22/2017 at 10:14 PM EDT LDL still above target. Increase pravastatin to 80 mg daily. Repeat lipids and liver in 3 months

## 2017-10-28 ENCOUNTER — Other Ambulatory Visit: Payer: 59 | Admitting: *Deleted

## 2017-10-28 DIAGNOSIS — E785 Hyperlipidemia, unspecified: Secondary | ICD-10-CM

## 2017-10-28 LAB — LIPID PANEL
CHOLESTEROL TOTAL: 207 mg/dL — AB (ref 100–199)
Chol/HDL Ratio: 3.8 ratio (ref 0.0–5.0)
HDL: 55 mg/dL (ref >39)
LDL Calculated: 123 mg/dL — ABNORMAL HIGH (ref 0–99)
TRIGLYCERIDES: 147 mg/dL (ref 0–149)
VLDL Cholesterol Cal: 29 mg/dL (ref 5–40)

## 2017-11-03 ENCOUNTER — Telehealth: Payer: Self-pay | Admitting: Interventional Cardiology

## 2017-11-03 DIAGNOSIS — E785 Hyperlipidemia, unspecified: Secondary | ICD-10-CM

## 2017-11-03 MED ORDER — PRAVASTATIN SODIUM 80 MG PO TABS: 80.0000 mg | ORAL_TABLET | Freq: Every evening | ORAL | 3 refills | Status: DC

## 2017-11-03 MED ORDER — EZETIMIBE 10 MG PO TABS: 10.0000 mg | ORAL_TABLET | Freq: Every day | ORAL | 3 refills | Status: DC

## 2017-11-03 NOTE — Telephone Encounter (Signed)
Mrs. George Burns is returning a call about [name] medication Pravastatin . He would like to stay on the same dosage , but id Dr. Eldridge DaceVaranasi wants to put him on a higher dosage that is fine . Please call

## 2017-11-03 NOTE — Telephone Encounter (Signed)
Returned call to patient's wife (DPR on file). She states that the patient wants to stay on pravastatin. I instructed her to increase his pravastatin to 80 mg daily and to start zetia 10 mg QD. Wife states that the patient does not want to start another medicine. I explained to her that increasing the pravastatin alone would not bring his LDL from 123 down to <70 and that is why the recommendation was to increase the pravastatin to 80 mg QD and start zetia 10 mg QD. Made the wife aware that it is very importantto get his LDL <70 since he has a history of CAD in order to reduce the risk of CV event. Wife agrees to increase pravastatin to 80 mg QD and start zetia 10 mg QD. Will recheck  LIPIDS and LFTS in 12 weeks. Rx sent to preferred pharmacy. Labs ordered and appointment made for 02/08/18.

## 2017-11-03 NOTE — Telephone Encounter (Signed)
Notes recorded by Corky CraftsVaranasi, Jayadeep S, MD on 11/02/2017 at 6:44 PM EST [facility]. ------  Notes recorded by Lattie Hawurrie, Areeba Sulser I, RN on 11/02/2017 at 12:44 PM EST Called patient to give lab results. Patient does not speak english and is requesting that I speak to his wife, Norman HerrlichStasia. Wife made aware of the patient's lab results and that his LDL is 123. Made his wife aware that his LDL target is <70 given his Hx of CAD in order to reduce the risk CV event. They have refused to increase his statin in the past year and have wanted to try diet and exercise. Made her aware of recommendations to either: 1) increase pravastatin to 80 mg QD and add zetia 10 mg QD, or 2) switch to a more potent statin rosuvastatin 40 mg QD. Wife states that she does not want to increase the pravastatin. She states that she is nervous about switching to a different statin because he has had elevated LFTS in the past. Made wife aware that the patient's elevated LFTS were on atorvastatin, not pravastatin or rosuvastatin. The wife states that the patient's PCP told them that his cholesterol was fine back when his LDL was 114. Reiterated to the wife that it is very important to get his LDL <70 since he has a history of CAD in order to reduce the risk of CV event and it is the recommendation of his cardiologist to make these medications changes to help reduce that risk since trying with diet and exercise have not proven to be helpful in doing so. Wife states that she will discuss this with her husband and call back tomorrow. ------  Notes recorded by Levin BaconAuten, Kelley M, RPH on 11/02/2017 at 7:47 AM EST Pt with LDL goal <70 due to hx of CAD. Increase of pravastatin to 80mg  would at most give additional 20% which would not get LDL to goal <70. Would recommend change to more potent statin (Crestor 40mg  daily) or dose increase pravastatin 80mg  plus Zetia 10mg  daily. Repeat lipid/hepatic panel in 8-12 weeks. ------  Notes recorded by  Corky CraftsVaranasi, Jayadeep S, MD on 11/01/2017 at 11:12 PM EST LDL above target. Not sure that prava 80 mg will get him to target. Check with lipid clinic.

## 2018-02-07 ENCOUNTER — Other Ambulatory Visit: Payer: Self-pay | Admitting: Interventional Cardiology

## 2018-02-08 ENCOUNTER — Other Ambulatory Visit: Payer: 59 | Admitting: *Deleted

## 2018-02-08 DIAGNOSIS — E785 Hyperlipidemia, unspecified: Secondary | ICD-10-CM

## 2018-02-08 LAB — HEPATIC FUNCTION PANEL
ALBUMIN: 4.7 g/dL (ref 3.6–4.8)
ALT: 24 IU/L (ref 0–44)
AST: 21 IU/L (ref 0–40)
Alkaline Phosphatase: 60 IU/L (ref 39–117)
Bilirubin Total: 0.6 mg/dL (ref 0.0–1.2)
Bilirubin, Direct: 0.17 mg/dL (ref 0.00–0.40)
TOTAL PROTEIN: 6.9 g/dL (ref 6.0–8.5)

## 2018-02-08 LAB — LIPID PANEL
CHOL/HDL RATIO: 3.1 ratio (ref 0.0–5.0)
Cholesterol, Total: 168 mg/dL (ref 100–199)
HDL: 54 mg/dL (ref >39)
LDL CALC: 94 mg/dL (ref 0–99)
TRIGLYCERIDES: 98 mg/dL (ref 0–149)
VLDL Cholesterol Cal: 20 mg/dL (ref 5–40)

## 2018-05-10 ENCOUNTER — Other Ambulatory Visit: Payer: Self-pay | Admitting: Interventional Cardiology

## 2018-05-13 NOTE — Progress Notes (Signed)
Cardiology Office Note   Date:  05/14/2018   ID:  George Burns, DOB [birth year], MRN [id number]  PCP:  Tracey HarriesBouska, David, MD    No chief complaint on file.  CAD  Wt Readings from Last 3 Encounters:  05/14/18 164 lb 1.9 oz (74.4 kg)  04/30/17 163 lb (73.9 kg)  04/21/17 167 lb 12.8 oz (76.1 kg)       History of Present Illness: George Burns is a 67 y.o. male with a hx of CAD s/p DES to LAD in 12/2013, HTN, HL. Labs demonstratedsignificantly increased LFTs while on Lipitor.  He had elbow surgery in 2018.  He has done well.  He went back to work.   Pravastatin was increased to 80 mg and Zetia started in Jan 2019.  LDL decreased from 123 to 94.  Target was 70.  LFTs were controlled.  Wife did not want the patient to change statins at that time.   Since starting the new meds, he reports more muscle pain and constipation.    Denies : Chest pain. Dizziness. Leg edema. Nitroglycerin use. Orthopnea. Palpitations. Paroxysmal nocturnal dyspnea. Shortness of breath. Syncope.   Exercising daily 15-20 minutes/daily.  He does floor exercises.  He walks a lot at work as well.   Past Medical History:  Diagnosis Date  . Colon polyp   . Coronary artery disease 2015   DES to LAD  . Elevated LFTs   . Gout   . HLD (hyperlipidemia)   . Hypertension   . Intermediate coronary syndrome (HCC)   . Renal cyst, left     Past Surgical History:  Procedure Laterality Date  . ANTERIOR CERVICAL DECOMP/DISCECTOMY FUSION N/A 03/12/2016   Procedure: ANTERIOR CERVICAL DECOMPRESSION FUSION CERVICAL 7 -THORACIC 1 WITH INSTRUMENTATION AND ALLOGRAFT;  Surgeon: Estill BambergMark Dumonski, MD;  Location: [facility];  Service: Orthopedics;  Laterality: N/A;  ANTERIOR CERVICAL DECOMPRESSION FUSION CERVICAL 7 -THORACIC 1 WITH INSTRUMENTATION AND ALLOGRAFT  . CARDIAC CATHETERIZATION    . COLONOSCOPY    . coronary stent placement    . LEFT HEART CATHETERIZATION WITH CORONARY ANGIOGRAM N/A 12/27/2013   Procedure: LEFT  HEART CATHETERIZATION WITH CORONARY ANGIOGRAM;  Surgeon: Lennette Biharihomas A Kelly, MD;  [facility];  Service: Cardiovascular;  Laterality: N/A;  . TRIGGER FINGER RELEASE Right 04/30/2017   Procedure: RIGHT LONG AND RING TRIGGER RELEASE;  Surgeon: Betha LoaKuzma, Kevin, MD;  Location: [facility];  Service: Orthopedics;  Laterality: Right;  . ULNAR NERVE TRANSPOSITION Right 04/30/2017   Procedure: RIGHT ULNAR NERVE DECOMPRESSION;  Surgeon: Betha LoaKuzma, Kevin, MD;  Location: [facility];  Service: Orthopedics;  Laterality: Right;     Current Outpatient Medications  Medication Sig Dispense Refill  . clopidogrel (PLAVIX) 75 MG tablet Take 1 tablet (75 mg total) by mouth daily. Please keep upcoming appointment for further refills 90 tablet 0  . Coenzyme Q10 (CO Q 10) 100 MG CAPS Take 100 mg by mouth 2 (two) times daily.    . metoprolol tartrate (LOPRESSOR) 25 MG tablet Take 0.5 tablets (12.5 mg total) by mouth 2 (two) times daily. 90 tablet 0  . nitroGLYCERIN (NITROSTAT) 0.4 MG SL tablet Place 0.4 mg under the tongue every 5 (five) minutes as needed for chest pain (X 3 DOSES FOR CHEST PAIN).    Marland Kitchen. ezetimibe (ZETIA) 10 MG tablet Take 1 tablet (10 mg total) by mouth daily. 90 tablet 3  . pravastatin (PRAVACHOL) 80 MG tablet Take 1 tablet (80 mg total) by  mouth every evening. 90 tablet 3   No current facility-administered medications for this visit.     Allergies:   Atorvastatin    Social History:  The patient  reports that he quit smoking about 33 years ago. His smoking use included cigarettes. He has never used smokeless tobacco. He reports that he drinks alcohol. He reports that he does not use drugs.   Family History:  The patient's family history includes Emphysema in his brother; Heart disease in his mother; Lung cancer in his father.    ROS:  Please see the history of present illness.   Otherwise, review of systems are positive for muscle pain.   All other systems are reviewed  and negative.    PHYSICAL EXAM: VS:  BP (!) 110/58   Pulse 60   Ht 5\' 6"  (1.676 m)   Wt 164 lb 1.9 oz (74.4 kg)   SpO2 94%   BMI 26.49 kg/m  , BMI Body mass index is 26.49 kg/m. GEN: Well nourished, well developed, in no acute distress  HEENT: normal  Neck: no JVD, carotid bruits, or masses Cardiac: RRR; no murmurs, rubs, or gallops,no edema  Respiratory:  clear to auscultation bilaterally, normal work of breathing GI: soft, nontender, nondistended, + BS MS: no deformity or atrophy ; right leg varicose veins Skin: warm and dry, no rash Neuro:  Strength and sensation are intact Psych: euthymic mood, full affect   EKG:   The ekg ordered today demonstrates NSR, inferior Q waves,  Unchanged from prior ECG in 2018   Recent Labs: 02/08/2018: ALT 24   Lipid Panel    Component Value Date/Time   CHOL 168 02/08/2018 0733   TRIG 98 02/08/2018 0733   HDL 54 02/08/2018 0733   CHOLHDL 3.1 02/08/2018 0733   CHOLHDL 3.9 06/30/2016 0759   VLDL 32 (H) 06/30/2016 0759   LDLCALC 94 02/08/2018 0733     Other studies Reviewed: Additional studies/ records that were reviewed today with results demonstrating: labs reviewed.   ASSESSMENT AND PLAN:  1. CAD: No angina on secondary prevention.  No bleeding problems. 2. Hyperlipidemia: LDL above target, but sx of increased muscle pain.   3. Muscle weakness/Constipation:  Mild muscle weakness.  Still able to work full time. Will check with PharmD regarding other options for lipids.  Increase fiber intake.  May need soluble fiber due to dentures.    Current medicines are reviewed at length with the patient today.  The patient concerns regarding his medicines were addressed.  The following changes have been made:  No change  Labs/ tests ordered today include:  No orders of the defined types were placed in this encounter.   Recommend 150 minutes/week of aerobic exercise Low fat, low carb, high fiber diet recommended  Disposition:   FU in  1 year   Signed, Lance Muss, MD  05/14/2018 1:47 PM    [facility] [address] [place], [place], [place]  [address] Phone: [phone]; Fax: [phone]

## 2018-05-14 ENCOUNTER — Ambulatory Visit: Payer: 59 | Admitting: Interventional Cardiology

## 2018-05-14 ENCOUNTER — Encounter: Payer: Self-pay | Admitting: Interventional Cardiology

## 2018-05-14 VITALS — BP 110/58 | HR 60 | Ht 66.0 in | Wt 164.1 lb

## 2018-05-14 DIAGNOSIS — E782 Mixed hyperlipidemia: Secondary | ICD-10-CM | POA: Diagnosis not present

## 2018-05-14 DIAGNOSIS — I25119 Atherosclerotic heart disease of native coronary artery with unspecified angina pectoris: Secondary | ICD-10-CM

## 2018-05-14 DIAGNOSIS — M6281 Muscle weakness (generalized): Secondary | ICD-10-CM

## 2018-05-14 MED ORDER — PRAVASTATIN SODIUM 80 MG PO TABS: 80.0000 mg | ORAL_TABLET | Freq: Every evening | ORAL | 3 refills | Status: DC

## 2018-05-14 MED ORDER — CLOPIDOGREL BISULFATE 75 MG PO TABS: 75.0000 mg | ORAL_TABLET | Freq: Every day | ORAL | 3 refills | Status: DC

## 2018-05-14 NOTE — Patient Instructions (Signed)

## 2018-05-17 ENCOUNTER — Telehealth: Payer: Self-pay

## 2018-05-17 NOTE — Telephone Encounter (Signed)
-----   Message from Corky CraftsJayadeep S Varanasi, MD sent at 05/17/2018 11:44 AM EDT -----   ----- Message ----- From: Levin BaconAuten, Kelley M, [facility] AntonioRPH Sent: 05/14/2018   4:36 PM EDT To: Corky CraftsJayadeep S Varanasi, MD  Could consider addition of PCSK9i therapy if he does not want to change statins (he is already on ezetimibe per our record). Thanks!  ----- Message ----- From: Corky CraftsVaranasi, Jayadeep S, MD Sent: 05/14/2018   2:02 PM EDT To: Levin BaconKelley M Auten, RPH  LDL above target but he is having more muscle pains...and did not want to change from prava to a different statin in the past. Any ideas?

## 2018-05-17 NOTE — Telephone Encounter (Signed)
Left message for patient to call back.   Author: Corky CraftsVaranasi, Jayadeep S, MD Filed: 05/17/2018 11:44 AM Please set up lipid clinic appt to discuss PCSK9 inhibitor possibility.

## 2018-05-17 NOTE — Progress Notes (Signed)
Please set up lipid clinic appt to discuss PCSK9 inhibitor possibility.

## 2018-05-18 NOTE — Telephone Encounter (Signed)
Follow Up: ° ° ° °Returning your call from yesterday. °

## 2018-05-18 NOTE — Telephone Encounter (Signed)
Called and spoke to patient's wife George Burns (DPR on file). Made her aware that Dr. Eldridge DaceVaranasi would like for patient to be seen in the Lipid Clinic to discuss PCSK9 inhibitor. Wife states that they would like to hold off for a few months to see if his muscle weakness/pains improves. Wife states that they will call back when they are ready to make any changes.

## 2018-05-27 ENCOUNTER — Other Ambulatory Visit: Payer: Self-pay | Admitting: Interventional Cardiology

## 2018-05-27 MED ORDER — PRAVASTATIN SODIUM 80 MG PO TABS: 80.0000 mg | ORAL_TABLET | Freq: Every evening | ORAL | 3 refills | Status: DC

## 2018-06-01 ENCOUNTER — Ambulatory Visit: Payer: Self-pay | Admitting: Interventional Cardiology

## 2018-07-20 ENCOUNTER — Other Ambulatory Visit: Payer: Self-pay | Admitting: Interventional Cardiology

## 2019-06-03 ENCOUNTER — Telehealth: Payer: Self-pay | Admitting: Interventional Cardiology

## 2019-06-03 NOTE — Telephone Encounter (Signed)
Called and made the patient's wife aware that she can come and interpret for hi at his visit on 9/1

## 2019-06-03 NOTE — Telephone Encounter (Signed)
Patient's spouse called wanting to know if she can home with husband to his appt so she can be interpreter for him.

## 2019-06-03 NOTE — Progress Notes (Signed)
Cardiology Office Note   Date:  06/07/2019   ID:  George Burns, DOB 1951-01-18, MRN [id number]  PCP:  Verlon AuBoyd, Tammy Lamonica, MD    No chief complaint on file.  CAD  Wt Readings from Last 3 Encounters:  06/07/19 163 lb (73.9 kg)  05/14/18 164 lb 1.9 oz (74.4 kg)  04/30/17 163 lb (73.9 kg)       History of Present Illness: George Burns is a 68 y.o. male  with a hx of CAD s/p DES to LAD in 12/2013, HTN, HL. Labs demonstratedsignificantly increased LFTs while on Lipitor.  He had elbow surgery in 2018.  He has done well.  He went back to work.   Pravastatin was increased to 80 mg and Zetia started in Jan 2019.  LDL decreased from 123 to 94.  Target was 70.  LFTs were controlled.  Wife did not want the patient to change statins at that time.   After starting the new meds, he reported more muscle pain and constipation.   In 2019, PCSK9 eval was offered but they did not feel the need to f/u.  Since the last visit, he had low diastolic BP.  He stopped his metoprolol.   Denies : Chest pain. Dizziness. Leg edema. Nitroglycerin use. Orthopnea. Palpitations. Paroxysmal nocturnal dyspnea. Shortness of breath. Syncope.   Walking daily for an hour.  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   Past Medical History:  Diagnosis Date  . Colon polyp   . Coronary artery disease 2015   DES to LAD  . Elevated LFTs   . Gout   . HLD (hyperlipidemia)   . Hypertension   . Intermediate coronary syndrome (HCC)   . Renal cyst, left     Past Surgical History:  Procedure Laterality Date  . ANTERIOR CERVICAL DECOMP/DISCECTOMY FUSION N/A 03/12/2016   Procedure: ANTERIOR CERVICAL DECOMPRESSION FUSION CERVICAL 7 -THORACIC 1 WITH INSTRUMENTATION AND ALLOGRAFT;  Surgeon: Estill BambergMark Dumonski, MD;  Location: [facility];  Service: Orthopedics;  Laterality: N/A;  ANTERIOR CERVICAL DECOMPRESSION FUSION CERVICAL 7 -THORACIC 1 WITH INSTRUMENTATION AND ALLOGRAFT  . CARDIAC  CATHETERIZATION    . COLONOSCOPY    . coronary stent placement    . LEFT HEART CATHETERIZATION WITH CORONARY ANGIOGRAM N/A 12/27/2013   Procedure: LEFT HEART CATHETERIZATION WITH CORONARY ANGIOGRAM;  Surgeon: Lennette Biharihomas A Kelly, MD;  [facility];  Service: Cardiovascular;  Laterality: N/A;  . TRIGGER FINGER RELEASE Right 04/30/2017   Procedure: RIGHT LONG AND RING TRIGGER RELEASE;  Surgeon: Betha LoaKuzma, Kevin, MD;  Location: [facility];  Service: Orthopedics;  Laterality: Right;  . ULNAR NERVE TRANSPOSITION Right 04/30/2017   Procedure: RIGHT ULNAR NERVE DECOMPRESSION;  Surgeon: Betha LoaKuzma, Kevin, MD;  Location: [facility];  Service: Orthopedics;  Laterality: Right;     Current Outpatient Medications  Medication Sig Dispense Refill  . clopidogrel (PLAVIX) 75 MG tablet Take 1 tablet (75 mg total) by mouth daily. 90 tablet 3  . Coenzyme Q10 (CO Q 10) 100 MG CAPS Take 100 mg by mouth 2 (two) times daily.    Marland Kitchen. ezetimibe (ZETIA) 10 MG tablet Take 1 tablet (10 mg total) by mouth daily. 90 tablet 3  . magnesium gluconate (MAGONATE) 500 MG tablet Take 250 mg by mouth daily. Take one half tablet by mouth once daily.    . nitroGLYCERIN (NITROSTAT) 0.4 MG SL tablet Place 0.4 mg under the tongue every 5 (five) minutes as needed for  chest pain (X 3 DOSES FOR CHEST PAIN).    Marland Kitchen pravastatin (PRAVACHOL) 80 MG tablet Take 1 tablet (80 mg total) by mouth every evening. 90 tablet 3  . Vitamin D, Ergocalciferol, (DRISDOL) 1.25 MG (50000 UT) CAPS capsule Take 1 capsule by mouth once a week for 3 months.    . metoprolol tartrate (LOPRESSOR) 25 MG tablet Take 0.5 tablets (12.5 mg total) by mouth 2 (two) times daily. (Patient not taking: Reported on 06/07/2019) 90 tablet 0   No current facility-administered medications for this visit.     Allergies:   Atorvastatin    Social History:  The patient  reports that he quit smoking about 34 years ago. His smoking use included cigarettes. He has  never used smokeless tobacco. He reports current alcohol use. He reports that he does not use drugs.   Family History:  The patient's family history includes Emphysema in his brother; Heart disease in his mother; Lung cancer in his father.    ROS:  Please see the history of present illness.   Otherwise, review of systems are positive for low Vit D.   All other systems are reviewed and negative.    PHYSICAL EXAM: VS:  BP 122/70   Pulse 71   Ht 5\' 6"  (1.676 m)   Wt 163 lb (73.9 kg)   BMI 26.31 kg/m  , BMI Body mass index is 26.31 kg/m. GEN: Well nourished, well developed, in no acute distress  HEENT: normal  Neck: no JVD, carotid bruits, or masses Cardiac: RRR; no murmurs, rubs, or gallops,no edema  Respiratory:  clear to auscultation bilaterally, normal work of breathing GI: soft, nontender, nondistended, + BS MS: no deformity or atrophy  Skin: warm and dry, no rash Neuro:  Strength and sensation are intact Psych: euthymic mood, full affect   EKG:   The ekg ordered today demonstrates NSR, RBBB, inf Q waves, no change from 2019   Recent Labs: No results found for requested labs within last 8760 hours.   Lipid Panel    Component Value Date/Time   CHOL 168 02/08/2018 0733   TRIG 98 02/08/2018 0733   HDL 54 02/08/2018 0733   CHOLHDL 3.1 02/08/2018 0733   CHOLHDL 3.9 06/30/2016 0759   VLDL 32 (H) 06/30/2016 0759   LDLCALC 94 02/08/2018 0733     Other studies Reviewed: Additional studies/ records that were reviewed today with results demonstrating: As above in 2019.Marland Kitchen LDL 81 iun 04/2019.  Glucose 105.  ASSESSMENT AND PLAN:  1. CAD: No angina.  COntinue aggressive secondary prevention.  Continue clopidogrel monotherapy.  No bleeding issues.  Holding metoprolol when low BP readings.  2. Hyperlipidemia: PCSK9 eval was offered but they did not feel the need to f/u.  Tolerating pravastatin and Zetia. Labs controlled in 04/2019.  COntinue healthy diet.  3. Muscle weakness:  Still has cramps, trying magnesium.  TOlerating current lipid lowering meds.    Current medicines are reviewed at length with the patient today.  The patient concerns regarding his medicines were addressed.  The following changes have been made:  No change  Labs/ tests ordered today include:  No orders of the defined types were placed in this encounter.   Recommend 150 minutes/week of aerobic exercise Low fat, low carb, high fiber diet recommended  Disposition:   FU in 1 year   Signed, Larae Grooms, MD  06/07/2019 8:40 AM    [facility] [place], [place], [place]  [address] Phone: [phone]; Fax: [phone]

## 2019-06-07 ENCOUNTER — Other Ambulatory Visit: Payer: Self-pay

## 2019-06-07 ENCOUNTER — Encounter: Payer: Self-pay | Admitting: Interventional Cardiology

## 2019-06-07 ENCOUNTER — Ambulatory Visit (INDEPENDENT_AMBULATORY_CARE_PROVIDER_SITE_OTHER): Payer: Medicare Other | Admitting: Interventional Cardiology

## 2019-06-07 VITALS — BP 122/70 | HR 71 | Ht 66.0 in | Wt 163.0 lb

## 2019-06-07 DIAGNOSIS — I25119 Atherosclerotic heart disease of native coronary artery with unspecified angina pectoris: Secondary | ICD-10-CM

## 2019-06-07 DIAGNOSIS — E785 Hyperlipidemia, unspecified: Secondary | ICD-10-CM | POA: Diagnosis not present

## 2019-06-07 DIAGNOSIS — M6281 Muscle weakness (generalized): Secondary | ICD-10-CM | POA: Diagnosis not present

## 2019-06-07 DIAGNOSIS — E782 Mixed hyperlipidemia: Secondary | ICD-10-CM

## 2019-06-07 NOTE — Patient Instructions (Signed)

## 2020-04-26 ENCOUNTER — Other Ambulatory Visit: Payer: Self-pay

## 2020-04-26 MED ORDER — CLOPIDOGREL BISULFATE 75 MG PO TABS: 75.0000 mg | ORAL_TABLET | Freq: Every day | ORAL | 0 refills | Status: DC

## 2020-04-26 MED ORDER — PRAVASTATIN SODIUM 80 MG PO TABS: 80.0000 mg | ORAL_TABLET | Freq: Every evening | ORAL | 0 refills | Status: DC

## 2020-04-26 NOTE — Telephone Encounter (Signed)
Pt's medication was sent to pt's pharmacy as requested. Confirmation received.  °

## 2020-06-06 NOTE — Progress Notes (Signed)
Cardiology Office Note   Date:  06/07/2020   ID:  George Burns, DOB [birth year], MRN [id number]  PCP:  Verlon Au, MD    No chief complaint on file.  CAD  Wt Readings from Last 3 Encounters:  06/07/20 165 lb 6.4 oz (75 kg)  06/07/19 163 lb (73.9 kg)  05/14/18 164 lb 1.9 oz (74.4 kg)       History of Present Illness: George Burns is a 69 y.o. male  with a hx of CAD s/p DES to LAD in 12/2013, HTN, HL. Labs demonstratedsignificantly increased LFTs while on Lipitor.  He had elbow surgery in 2018.He has done well. He went back to work.   Pravastatin was increased to 80 mg and Zetia started in Jan 2019. LDL decreased from 123 to 94. Target was 70. LFTs were controlled. Wife did not want the patient to change statins at that time.   After starting the new meds, he reported more muscle pain and constipation.  In 2019, [facility] eval was offered but they did not feel the need to f/u.  He has had low diastolic BP.  He stopped his metoprolol.   Since the last visit, he has done well.  Lipids checked at [facility] were controlled.   Denies : Chest pain. Dizziness. Leg edema. Nitroglycerin use. Orthopnea. Palpitations. Paroxysmal nocturnal dyspnea. Shortness of breath. Syncope.   He has low Vit D.     Past Medical History:  Diagnosis Date  . Colon polyp   . Coronary artery disease 2015   DES to LAD  . Elevated LFTs   . Gout   . HLD (hyperlipidemia)   . Hypertension   . Intermediate coronary syndrome (HCC)   . Renal cyst, left     Past Surgical History:  Procedure Laterality Date  . ANTERIOR CERVICAL DECOMP/DISCECTOMY FUSION N/A 03/12/2016   Procedure: ANTERIOR CERVICAL DECOMPRESSION FUSION CERVICAL 7 -THORACIC 1 WITH INSTRUMENTATION AND ALLOGRAFT;  Surgeon: Estill Bamberg, MD;  Location: [facility] OR;  Service: Orthopedics;  Laterality: N/A;  ANTERIOR CERVICAL DECOMPRESSION FUSION CERVICAL 7 -THORACIC 1 WITH INSTRUMENTATION AND ALLOGRAFT  .  CARDIAC CATHETERIZATION    . COLONOSCOPY    . coronary stent placement    . LEFT HEART CATHETERIZATION WITH CORONARY ANGIOGRAM N/A 12/27/2013   Procedure: LEFT HEART CATHETERIZATION WITH CORONARY ANGIOGRAM;  Surgeon: Lennette Bihari, MD;  [facility]: [facility] CATH LAB;  Service: Cardiovascular;  Laterality: N/A;  . TRIGGER FINGER RELEASE Right 04/30/2017   Procedure: RIGHT LONG AND RING TRIGGER RELEASE;  Surgeon: Betha Loa, MD;  Location: [facility];  Service: Orthopedics;  Laterality: Right;  . ULNAR NERVE TRANSPOSITION Right 04/30/2017   Procedure: RIGHT ULNAR NERVE DECOMPRESSION;  Surgeon: Betha Loa, MD;  Location: [facility];  Service: Orthopedics;  Laterality: Right;     Current Outpatient Medications  Medication Sig Dispense Refill  . clopidogrel (PLAVIX) 75 MG tablet Take 1 tablet (75 mg total) by mouth daily. Please keep upcoming appt in September with Dr. Eldridge Dace for future refills. Thank you 90 tablet 0  . Coenzyme Q10 (CO Q 10) 100 MG CAPS Take 100 mg by mouth 2 (two) times daily.    . magnesium gluconate (MAGONATE) 500 MG tablet Take 250 mg by mouth daily. Take one half tablet by mouth once daily.    . metoprolol tartrate (LOPRESSOR) 25 MG tablet Take 0.5 tablets (12.5 mg total) by mouth 2 (two) times daily. (Patient taking differently: Take 12.5 mg by  mouth as needed (high blood pressure). ) 90 tablet 0  . nitroGLYCERIN (NITROSTAT) 0.4 MG SL tablet Place 0.4 mg under the tongue every 5 (five) minutes as needed for chest pain (X 3 DOSES FOR CHEST PAIN).    Marland Kitchen pravastatin (PRAVACHOL) 80 MG tablet Take 1 tablet (80 mg total) by mouth every evening. Please keep upcoming appt in September with Dr. Eldridge Dace for future refills. Thank you 90 tablet 0  . Vitamin D, Ergocalciferol, (DRISDOL) 1.25 MG (50000 UT) CAPS capsule Take 1 capsule by mouth once a week for 3 months.     No current facility-administered medications for this visit.    Allergies:   Atorvastatin      Social History:  The patient  reports that he quit smoking about 35 years ago. His smoking use included cigarettes. He has never used smokeless tobacco. He reports current alcohol use. He reports that he does not use drugs.   Family History:  The patient's *family history includes Emphysema in his brother; Heart disease in his mother; Lung cancer in his father.    ROS:  Please see the history of present illness.   Otherwise, review of systems are positive for right shoulder pain, clicking.   All other systems are reviewed and negative.    PHYSICAL EXAM: VS:  BP (!) 142/74   Pulse 61   Ht 5\' 6"  (1.676 m)   Wt 165 lb 6.4 oz (75 kg)   SpO2 93%   BMI 26.70 kg/m  , BMI Body mass index is 26.7 kg/m. GEN: Well nourished, well developed, in no acute distress  HEENT: normal  Neck: no JVD, carotid bruits, or masses Cardiac: RRR; no murmurs, rubs, or gallops,no edema  Respiratory:  clear to auscultation bilaterally, normal work of breathing GI: soft, nontender, nondistended, + BS MS: no deformity or atrophy  Skin: warm and dry, no rash Neuro:  Strength and sensation are intact Psych: euthymic mood, full affect   EKG:   The ekg ordered today demonstrates NSR, RBBB, no ST changes   Recent Labs: No results found for requested labs within last 8760 hours.   Lipid Panel    Component Value Date/Time   CHOL 168 02/08/2018 0733   TRIG 98 02/08/2018 0733   HDL 54 02/08/2018 0733   CHOLHDL 3.1 02/08/2018 0733   CHOLHDL 3.9 06/30/2016 0759   VLDL 32 (H) 06/30/2016 0759   LDLCALC 94 02/08/2018 0733     Other studies Reviewed: Additional studies/ records that were reviewed today with results demonstrating: labs reviewed form PMD.   ASSESSMENT AND PLAN:  1. CAD: No angina. COntinue aggressive secondary prevention.   2. Hyperlipidemia: Labs reviewed from Care everywhere. The current medical regimen is effective;  continue present plan and medications.  LFTs normal. 3. A1C 5.8.-  elevated blood glucose. healthy, whole food, plant based diet recommended. 4. I recommended COVID vaccine but they declined.  THey are concerned about getting sick from the vaccine because of their childrens' experiences.   Current medicines are reviewed at length with the patient today.  The patient concerns regarding his medicines were addressed.  The following changes have been made:  No change  Labs/ tests ordered today include:   Orders Placed This Encounter  Procedures  . EKG 12-Lead    Recommend 150 minutes/week of aerobic exercise Low fat, low carb, high fiber diet recommended  Disposition:   FU in 1 year   Signed, 04/10/2018, MD  06/07/2020 9:44 AM  [facility] [address], [place], [place]  [address] Phone: [phone]; Fax: [phone]

## 2020-06-07 ENCOUNTER — Ambulatory Visit (INDEPENDENT_AMBULATORY_CARE_PROVIDER_SITE_OTHER): Payer: Medicare HMO | Admitting: Interventional Cardiology

## 2020-06-07 ENCOUNTER — Other Ambulatory Visit: Payer: Self-pay

## 2020-06-07 ENCOUNTER — Encounter: Payer: Self-pay | Admitting: Interventional Cardiology

## 2020-06-07 VITALS — BP 142/74 | HR 61 | Ht 66.0 in | Wt 165.4 lb

## 2020-06-07 DIAGNOSIS — E782 Mixed hyperlipidemia: Secondary | ICD-10-CM | POA: Diagnosis not present

## 2020-06-07 DIAGNOSIS — R7309 Other abnormal glucose: Secondary | ICD-10-CM | POA: Diagnosis not present

## 2020-06-07 DIAGNOSIS — I25119 Atherosclerotic heart disease of native coronary artery with unspecified angina pectoris: Secondary | ICD-10-CM | POA: Diagnosis not present

## 2020-06-07 DIAGNOSIS — Z2821 Immunization not carried out because of patient refusal: Secondary | ICD-10-CM | POA: Diagnosis not present

## 2020-06-07 NOTE — Patient Instructions (Addendum)
Medication Instructions:  Your physician recommends that you continue on your current medications as directed. Please refer to the Current Medication list given to you today.  *If you need a refill on your cardiac medications before your next appointment, please call your pharmacy*   Lab Work: None  If you have labs (blood work) drawn today and your tests are completely normal, you will receive your results only by: . MyChart Message (if you have MyChart) OR . A paper copy in the mail If you have any lab test that is abnormal or we need to change your treatment, we will call you to review the results.   Testing/Procedures: None  Follow-Up: At [facility], you and your health needs are our priority.  As part of our continuing mission to provide you with exceptional heart care, we have created designated Provider Care Teams.  These Care Teams include your primary Cardiologist (physician) and Advanced Practice Providers (APPs -  Physician Assistants and Nurse Practitioners) who all work together to provide you with the care you need, when you need it.  We recommend signing up for the patient portal called "MyChart".  Sign up information is provided on this After Visit Summary.  MyChart is used to connect with patients for Virtual Visits (Telemedicine).  Patients are able to view lab/test results, encounter notes, upcoming appointments, etc.  Non-urgent messages can be sent to your provider as well.   To learn more about what you can do with MyChart, go to [email or web address].    Your next appointment:   12 month(s)  The format for your next appointment:   In Person  Provider:   You may see Jayadeep Varanasi, MD or one of the following Advanced Practice Providers on your designated Care Team:    Dayna Dunn, PA-C  Michele Lenze, PA-C    Other Instructions  High-Fiber Diet Fiber, also called dietary fiber, is a type of carbohydrate that is found in fruits, vegetables, whole  grains, and beans. A high-fiber diet can have many health benefits. Your health care provider may recommend a high-fiber diet to help:  Prevent constipation. Fiber can make your bowel movements more regular.  Lower your cholesterol.  Relieve the following conditions: ? Swelling of veins in the anus (hemorrhoids). ? Swelling and irritation (inflammation) of specific areas of the digestive tract (uncomplicated diverticulosis). ? A problem of the large intestine (colon) that sometimes causes pain and diarrhea (irritable bowel syndrome, IBS).  Prevent overeating as part of a weight-loss plan.  Prevent heart disease, type 2 diabetes, and certain cancers. What is my plan? The recommended daily fiber intake in grams (g) includes:  38 g for men age 50 or younger.  30 g for men over age 50.  25 g for women age 50 or younger.  21 g for women over age 50. You can get the recommended daily intake of dietary fiber by:  Eating a variety of fruits, vegetables, grains, and beans.  Taking a fiber supplement, if it is not possible to get enough fiber through your diet. What do I need to know about a high-fiber diet?  It is better to get fiber through food sources rather than from fiber supplements. There is not a lot of research about how effective supplements are.  Always check the fiber content on the nutrition facts label of any prepackaged food. Look for foods that contain 5 g of fiber or more per serving.  Talk with a diet and nutrition specialist (dietitian) if you   have questions about specific foods that are recommended or not recommended for your medical condition, especially if those foods are not listed below.  Gradually increase how much fiber you consume. If you increase your intake of dietary fiber too quickly, you may have bloating, cramping, or gas.  Drink plenty of water. Water helps you to digest fiber. What are tips for following this plan?  Eat a wide variety of high-fiber  foods.  Make sure that half of the grains that you eat each day are whole grains.  Eat breads and cereals that are made with whole-grain flour instead of refined flour or white flour.  Eat brown rice, bulgur wheat, or millet instead of white rice.  Start the day with a breakfast that is high in fiber, such as a cereal that contains 5 g of fiber or more per serving.  Use beans in place of meat in soups, salads, and pasta dishes.  Eat high-fiber snacks, such as berries, raw vegetables, nuts, and popcorn.  Choose whole fruits and vegetables instead of processed forms like juice or sauce. What foods can I eat?  Fruits Berries. Pears. Apples. Oranges. Avocado. Prunes and raisins. Dried figs. Vegetables Sweet potatoes. Spinach. Kale. Artichokes. Cabbage. Broccoli. Cauliflower. Green peas. Carrots. Squash. Grains Whole-grain breads. Multigrain cereal. Oats and oatmeal. Brown rice. Barley. Bulgur wheat. Millet. Quinoa. Bran muffins. Popcorn. Rye wafer crackers. Meats and other proteins Navy, kidney, and pinto beans. Soybeans. Split peas. Lentils. Nuts and seeds. Dairy Fiber-fortified yogurt. Beverages Fiber-fortified soy milk. Fiber-fortified orange juice. Other foods Fiber bars. The items listed above may not be a complete list of recommended foods and beverages. Contact a dietitian for more options. What foods are not recommended? Fruits Fruit juice. Cooked, strained fruit. Vegetables Fried potatoes. Canned vegetables. Well-cooked vegetables. Grains White bread. Pasta made with refined flour. White rice. Meats and other proteins Fatty cuts of meat. Fried chicken or fried fish. Dairy Milk. Yogurt. Cream cheese. Sour cream. Fats and oils Butters. Beverages Soft drinks. Other foods Cakes and pastries. The items listed above may not be a complete list of foods and beverages to avoid. Contact a dietitian for more information. Summary  Fiber is a type of carbohydrate. It is  found in fruits, vegetables, whole grains, and beans.  There are many health benefits of eating a high-fiber diet, such as preventing constipation, lowering blood cholesterol, helping with weight loss, and reducing your risk of heart disease, diabetes, and certain cancers.  Gradually increase your intake of fiber. Increasing too fast can result in cramping, bloating, and gas. Drink plenty of water while you increase your fiber.  The best sources of fiber include whole fruits and vegetables, whole grains, nuts, seeds, and beans. This information is not intended to replace advice given to you by your health care provider. Make sure you discuss any questions you have with your health care provider. Document Revised: 07/27/2017 Document Reviewed: 07/27/2017 Elsevier Patient Education  2020 [employer]   

## 2020-07-22 ENCOUNTER — Other Ambulatory Visit: Payer: Self-pay | Admitting: Interventional Cardiology
